# Patient Record
Sex: Male | Born: 1995 | Race: White | Hispanic: No | Smoking: Current every day smoker
Health system: Southern US, Community
[De-identification: ages and names within clinical notes are randomized; demographics above are authoritative.]

## PROBLEM LIST (undated history)

## (undated) DIAGNOSIS — J45909 Unspecified asthma, uncomplicated: Secondary | ICD-10-CM

---
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
## 2015-01-27 ENCOUNTER — Encounter (HOSPITAL_COMMUNITY): Payer: Self-pay

## 2015-01-27 ENCOUNTER — Emergency Department (HOSPITAL_COMMUNITY)
Admission: EM | Admit: 2015-01-27 | Discharge: 2015-01-27 | Disposition: A | Discharge status: Home / Self Care | Payer: Medicaid Other | Attending: Emergency Medicine | Admitting: Emergency Medicine

## 2015-01-27 DIAGNOSIS — R1030 Lower abdominal pain, unspecified: Secondary | ICD-10-CM | POA: Diagnosis present

## 2015-01-27 DIAGNOSIS — Z720 Tobacco use: Secondary | ICD-10-CM | POA: Insufficient documentation

## 2015-01-27 DIAGNOSIS — I889 Nonspecific lymphadenitis, unspecified: Secondary | ICD-10-CM

## 2015-01-27 DIAGNOSIS — Z792 Long term (current) use of antibiotics: Secondary | ICD-10-CM | POA: Diagnosis not present

## 2015-01-27 DIAGNOSIS — J45909 Unspecified asthma, uncomplicated: Secondary | ICD-10-CM | POA: Insufficient documentation

## 2015-01-27 MED ORDER — DOXYCYCLINE HYCLATE 100 MG PO CAPS: 100.0000 mg | ORAL_CAPSULE | Freq: Two times a day (BID) | ORAL | Status: DC

## 2015-01-27 MED ORDER — NAPROXEN 500 MG PO TABS: 500.0000 mg | ORAL_TABLET | Freq: Two times a day (BID) | ORAL | Status: DC

## 2015-01-27 NOTE — Discharge Instructions (Signed)
Lymphadenopathy  Lymphadenopathy means "disease of the lymph glands." But the term is usually used to describe swollen or enlarged lymph glands, also called lymph nodes. These are the bean-shaped organs found in many locations including the neck, underarm, and groin. Lymph glands are part of the immune system, which fights infections in your body. Lymphadenopathy can occur in just one area of the body, such as the neck, or it can be generalized, with lymph node enlargement in several areas. The nodes found in the neck are the most common sites of lymphadenopathy.  CAUSES  When your immune system responds to germs (such as viruses or bacteria ), infection-fighting cells and fluid build up. This causes the glands to grow in size. Usually, this is not something to worry about. Sometimes, the glands themselves can become infected and inflamed. This is called lymphadenitis.  Enlarged lymph nodes can be caused by many diseases:  · Bacterial disease, such as strep throat or a skin infection.  · Viral disease, such as a common cold.  · Other germs, such as Lyme disease, tuberculosis, or sexually transmitted diseases.  · Cancers, such as lymphoma (cancer of the lymphatic system) or leukemia (cancer of the white blood cells).  · Inflammatory diseases such as lupus or rheumatoid arthritis.  · Reactions to medications.  Many of the diseases above are rare, but important. This is why you should see your caregiver if you have lymphadenopathy.  SYMPTOMS  · Swollen, enlarged lumps in the neck, back of the head, or other locations.  · Tenderness.  · Warmth or redness of the skin over the lymph nodes.  · Fever.  DIAGNOSIS  Enlarged lymph nodes are often near the source of infection. They can help health care providers diagnose your illness. For instance:  · Swollen lymph nodes around the jaw might be caused by an infection in the mouth.  · Enlarged glands in the neck often signal a throat infection.  · Lymph nodes that are swollen in  more than one area often indicate an illness caused by a virus.  Your caregiver will likely know what is causing your lymphadenopathy after listening to your history and examining you. Blood tests, x-rays, or other tests may be needed. If the cause of the enlarged lymph node cannot be found, and it does not go away by itself, then a biopsy may be needed. Your caregiver will discuss this with you.  TREATMENT  Treatment for your enlarged lymph nodes will depend on the cause. Many times the nodes will shrink to normal size by themselves, with no treatment. Antibiotics or other medicines may be needed for infection. Only take over-the-counter or prescription medicines for pain, discomfort, or fever as directed by your caregiver.  HOME CARE INSTRUCTIONS  Swollen lymph glands usually return to normal when the underlying medical condition goes away. If they persist, contact your health-care provider. He/she might prescribe antibiotics or other treatments, depending on the diagnosis. Take any medications exactly as prescribed. Keep any follow-up appointments made to check on the condition of your enlarged nodes.  SEEK MEDICAL CARE IF:  · Swelling lasts for more than two weeks.  · You have symptoms such as weight loss, night sweats, fatigue, or fever that does not go away.  · The lymph nodes are hard, seem fixed to the skin, or are growing rapidly.  · Skin over the lymph nodes is red and inflamed. This could mean there is an infection.  SEEK IMMEDIATE MEDICAL CARE IF:  · Fluid   starts leaking from the area of the enlarged lymph node.  · You develop a fever of 102° F (38.9° C) or greater.  · Severe pain develops (not necessarily at the site of a large lymph node).  · You develop chest pain or shortness of breath.  · You develop worsening abdominal pain.  MAKE SURE YOU:  · Understand these instructions.  · Will watch your condition.  · Will get help right away if you are not doing well or get worse.  Document Released:  02/06/2008 Document Revised: 09/13/2013 Document Reviewed: 02/06/2008  ExitCare® Patient Information ©2015 [employer]. This information is not intended to replace advice given to you by your health care provider. Make sure you discuss any questions you have with your health care provider.  Swollen Lymph Nodes  The lymphatic system filters fluid from around cells. It is like a system of blood vessels. These channels carry lymph instead of blood. The lymphatic system is an important part of the immune (disease fighting) system. When people talk about "swollen glands in the neck," they are usually talking about swollen lymph nodes. The lymph nodes are like the little traps for infection. You and your caregiver may be able to feel lymph nodes, especially swollen nodes, in these common areas: the groin (inguinal area), armpits (axilla), and above the clavicle (supraclavicular). You may also feel them in the neck (cervical) and the back of the head just above the hairline (occipital).  Swollen glands occur when there is any condition in which the body responds with an allergic type of reaction. For instance, the glands in the neck can become swollen from insect bites or any type of minor infection on the head. These are very noticeable in children with only minor problems. Lymph nodes may also become swollen when there is a tumor or problem with the lymphatic system, such as Hodgkin's disease.  TREATMENT   · Most swollen glands do not require treatment. They can be observed (watched) for a short period of time, if your caregiver feels it is necessary. Most of the time, observation is not necessary.  · Antibiotics (medicines that kill germs) may be prescribed by your caregiver. Your caregiver may prescribe these if he or she feels the swollen glands are due to a bacterial (germ) infection. Antibiotics are not used if the swollen glands are caused by a virus.  HOME CARE INSTRUCTIONS   · Take medications as directed by  your caregiver. Only take over-the-counter or prescription medicines for pain, discomfort, or fever as directed by your caregiver.  SEEK MEDICAL CARE IF:   · If you begin to run a temperature greater than 102° F (38.9° C), or as your caregiver suggests.  MAKE SURE YOU:   · Understand these instructions.  · Will watch your condition.  · Will get help right away if you are not doing well or get worse.  Document Released: 04/19/2002 Document Revised: 07/22/2011 Document Reviewed: 04/29/2005  ExitCare® Patient Information ©2015 [employer]. This information is not intended to replace advice given to you by your health care provider. Make sure you discuss any questions you have with your health care provider.

## 2015-01-27 NOTE — ED Notes (Signed)
MD at bedside. 

## 2015-01-27 NOTE — ED Provider Notes (Signed)
CSN: [id number]     Arrival date & time 01/27/15  0741 History   First MD Initiated Contact with Patient 01/27/15 0745     Chief Complaint  Patient presents with  . Groin Pain      HPI  Patient presents with a concerned "I think I might have a blood clot".  He states that for about the last 6-8 weeks he's noticed the swelling in his right groin. States it is intermittently painful. States a week ago for "about like a golf ball". When it was swollen and it gave him some pain into his upper thigh. This seemed to be better at his last swollen. However remains tender to touch. Straight feels like his close irritated. Has had no leg infections with redness. No pelvic or GU infections. No history of STDs or UTIs. No skin abnormalities.  Past Medical History  Diagnosis Date  . Asthma    History reviewed. No pertinent past surgical history. Family History  Problem Relation Age of Onset  . Asthma Mother   . Diabetes Other    Social History  Substance Use Topics  . Smoking status: Current Every Day Smoker -- 1.00 packs/day    Types: Cigarettes  . Smokeless tobacco: None  . Alcohol Use: Yes     Comment: <1 time a month    Review of Systems  Constitutional: Negative for fever, chills, diaphoresis, appetite change and fatigue.  HENT: Negative for mouth sores, sore throat and trouble swallowing.   Eyes: Negative for visual disturbance.  Respiratory: Negative for cough, chest tightness, shortness of breath and wheezing.   Cardiovascular: Negative for chest pain.  Gastrointestinal: Negative for nausea, vomiting, abdominal pain, diarrhea and abdominal distention.  Endocrine: Negative for polydipsia, polyphagia and polyuria.  Genitourinary: Negative for dysuria, frequency and hematuria.       Tender lump in the right groin.  Musculoskeletal: Negative for gait problem.       Recent pain in the upper anterior thigh.  Skin: Negative for color change, pallor and rash.  Neurological: Negative  for dizziness, syncope, light-headedness and headaches.  Hematological: Does not bruise/bleed easily.  Psychiatric/Behavioral: Negative for behavioral problems and confusion.      Allergies  Review of patient's allergies indicates no known allergies.  Home Medications   Prior to Admission medications   Medication Sig Start Date End Date Taking? Authorizing Provider  amoxicillin (AMOXIL) 500 MG capsule Take 1 capsule (500 mg total) by mouth 3 (three) times daily. For 10 days Patient not taking: Reported on 06/21/2014 02/23/14   Tammy Triplett, PA-C  cephALEXin (KEFLEX) 500 MG capsule Take 2 capsules (1,000 mg total) by mouth 2 (two) times daily. 10/14/14   Burgess Amor, PA-C  diphenoxylate-atropine (LOMOTIL) 2.5-0.025 MG per tablet Take 1 tablet by mouth 4 (four) times daily as needed for diarrhea or loose stools. Patient not taking: Reported on 06/21/2014 01/25/13   Burgess Amor, PA-C  doxycycline (VIBRAMYCIN) 100 MG capsule Take 1 capsule (100 mg total) by mouth 2 (two) times daily. 01/27/15   Rolland Porter, MD  HYDROcodone-acetaminophen (NORCO/VICODIN) 5-325 MG per tablet Take one-two tabs po q 4-6 hrs prn pain Patient not taking: Reported on 06/21/2014 02/23/14   Tammy Triplett, PA-C  mupirocin ointment (BACTROBAN) 2 % Apply to the affected areas TID x 10 days Patient not taking: Reported on 06/21/2014 02/23/14   Tammy Triplett, PA-C  naproxen (NAPROSYN) 500 MG tablet Take 1 tablet (500 mg total) by mouth 2 (two) times daily. 01/27/15  Rolland Porter, MD  ondansetron (ZOFRAN ODT) 8 MG disintegrating tablet Take 1 tablet (8 mg total) by mouth every 8 (eight) hours as needed for nausea or vomiting. Patient not taking: Reported on 10/14/2014 06/21/14   Margarita Grizzle, MD  ondansetron (ZOFRAN) 8 MG tablet Take 1 tablet (8 mg total) by mouth every 8 (eight) hours as needed for nausea. Patient not taking: Reported on 06/21/2014 01/25/13   Burgess Amor, PA-C  predniSONE (DELTASONE) 10 MG tablet Take 6 tabs daily by mouth for  2 day,  Then 5 tabs daily for 2 days,  4 tabs daily for 2 days,  3 tabs daily for 2 days,  2 tabs daily for 2 days,  Then 1 tab daily for 2 days. 10/14/14   Burgess Amor, PA-C  PROAIR HFA 108 (90 BASE) MCG/ACT inhaler inhale 2 puffs by mouth every 4 hours AS NEEDED FOR WHEEZING 03/17/13   Merlyn Albert, MD   BP 159/77 mmHg  Pulse 67  Temp(Src) 97.8 F (36.6 C) (Oral)  Resp 20  Ht 5\' 7"  (1.702 m)  Wt 135 lb (61.236 kg)  BMI 21.14 kg/m2  SpO2 100% Physical Exam  Constitutional: He is oriented to person, place, and time. He appears well-developed and well-nourished. No distress.  HENT:  Head: Normocephalic.  Eyes: Conjunctivae are normal. Pupils are equal, round, and reactive to light. No scleral icterus.  Neck: Normal range of motion. Neck supple. No thyromegaly present.  Cardiovascular: Normal rate and regular rhythm.  Exam reveals no gallop and no friction rub.   No murmur heard. Pulmonary/Chest: Effort normal and breath sounds normal. No respiratory distress. He has no wheezes. He has no rales.  Abdominal: Soft. Bowel sounds are normal. He exhibits no distension. There is no tenderness. There is no rebound.  Genitourinary:     Musculoskeletal: Normal range of motion.  Neurological: He is alert and oriented to person, place, and time.  Skin: Skin is warm and dry. No rash noted.  Psychiatric: He has a normal mood and affect. His behavior is normal.    ED Course  Procedures (including critical care time) Labs Review Labs Reviewed - No data to display  Imaging Review No results found. I have personally reviewed and evaluated these images and lab results as part of my medical decision-making.   EKG Interpretation None      MDM   Final diagnoses:  Inguinal lymphadenitis    Enlarged inflamed inguinal lymph node without obvious additional lower leg infection or GU infection. Plan doxycycline, naproxen. Acid take the doxycycline with food to avoid GI upset. Warned of the  photosensitivity related to the tetracyclines.    Rolland Porter, MD 01/27/15 [phone]

## 2015-01-27 NOTE — ED Notes (Signed)
Pt reports pressure in r groin and intermittent numbness in r leg for the past year.

## 2015-04-26 ENCOUNTER — Encounter (HOSPITAL_COMMUNITY): Payer: Self-pay | Admitting: *Deleted

## 2015-04-26 ENCOUNTER — Emergency Department (HOSPITAL_COMMUNITY)
Admission: EM | Admit: 2015-04-26 | Discharge: 2015-04-26 | Disposition: A | Discharge status: Home / Self Care | Payer: Medicaid Other | Attending: Emergency Medicine | Admitting: Emergency Medicine

## 2015-04-26 DIAGNOSIS — Z792 Long term (current) use of antibiotics: Secondary | ICD-10-CM | POA: Diagnosis not present

## 2015-04-26 DIAGNOSIS — Z791 Long term (current) use of non-steroidal anti-inflammatories (NSAID): Secondary | ICD-10-CM | POA: Insufficient documentation

## 2015-04-26 DIAGNOSIS — S29012A Strain of muscle and tendon of back wall of thorax, initial encounter: Secondary | ICD-10-CM

## 2015-04-26 DIAGNOSIS — Z7951 Long term (current) use of inhaled steroids: Secondary | ICD-10-CM | POA: Insufficient documentation

## 2015-04-26 DIAGNOSIS — S233XXA Sprain of ligaments of thoracic spine, initial encounter: Secondary | ICD-10-CM

## 2015-04-26 DIAGNOSIS — X500XXA Overexertion from strenuous movement or load, initial encounter: Secondary | ICD-10-CM | POA: Insufficient documentation

## 2015-04-26 DIAGNOSIS — S239XXA Sprain of unspecified parts of thorax, initial encounter: Secondary | ICD-10-CM | POA: Insufficient documentation

## 2015-04-26 DIAGNOSIS — Z7952 Long term (current) use of systemic steroids: Secondary | ICD-10-CM | POA: Insufficient documentation

## 2015-04-26 DIAGNOSIS — Y9289 Other specified places as the place of occurrence of the external cause: Secondary | ICD-10-CM | POA: Diagnosis not present

## 2015-04-26 DIAGNOSIS — J45909 Unspecified asthma, uncomplicated: Secondary | ICD-10-CM | POA: Insufficient documentation

## 2015-04-26 DIAGNOSIS — Y998 Other external cause status: Secondary | ICD-10-CM | POA: Diagnosis not present

## 2015-04-26 DIAGNOSIS — S29019A Strain of muscle and tendon of unspecified wall of thorax, initial encounter: Secondary | ICD-10-CM | POA: Insufficient documentation

## 2015-04-26 DIAGNOSIS — Y9389 Activity, other specified: Secondary | ICD-10-CM | POA: Diagnosis not present

## 2015-04-26 DIAGNOSIS — F17210 Nicotine dependence, cigarettes, uncomplicated: Secondary | ICD-10-CM | POA: Insufficient documentation

## 2015-04-26 DIAGNOSIS — S3992XA Unspecified injury of lower back, initial encounter: Secondary | ICD-10-CM | POA: Diagnosis present

## 2015-04-26 MED ORDER — METHOCARBAMOL 500 MG PO TABS: 500.0000 mg | ORAL_TABLET | Freq: Three times a day (TID) | ORAL | Status: DC

## 2015-04-26 MED ORDER — DICLOFENAC SODIUM 75 MG PO TBEC: 75.0000 mg | DELAYED_RELEASE_TABLET | Freq: Two times a day (BID) | ORAL | Status: DC

## 2015-04-26 MED ORDER — DEXAMETHASONE 4 MG PO TABS: 4.0000 mg | ORAL_TABLET | Freq: Two times a day (BID) | ORAL | Status: DC

## 2015-04-26 MED ORDER — DIAZEPAM 5 MG PO TABS
10.0000 mg | ORAL_TABLET | Freq: Once | ORAL | Status: AC
  Administered 2015-04-26: 10 mg via ORAL
  Filled 2015-04-26: qty 2

## 2015-04-26 MED ORDER — KETOROLAC TROMETHAMINE 10 MG PO TABS
10.0000 mg | ORAL_TABLET | Freq: Once | ORAL | Status: AC
  Administered 2015-04-26: 10 mg via ORAL
  Filled 2015-04-26: qty 1

## 2015-04-26 MED ORDER — ONDANSETRON HCL 4 MG PO TABS
4.0000 mg | ORAL_TABLET | Freq: Once | ORAL | Status: AC
  Administered 2015-04-26: 4 mg via ORAL
  Filled 2015-04-26: qty 1

## 2015-04-26 MED ORDER — PREDNISONE 50 MG PO TABS
60.0000 mg | ORAL_TABLET | Freq: Once | ORAL | Status: AC
  Administered 2015-04-26: 60 mg via ORAL
  Filled 2015-04-26: qty 1

## 2015-04-26 NOTE — Discharge Instructions (Signed)
Please use warm Epson Salt baths daily until pain resolves. Use robaxin with caution. It may cause drowsiness. Use the other meds as suggested. See Dr August Saucerean for orthopedic evaluation if not improving. Muscle Strain A muscle strain (pulled muscle) happens when a muscle is stretched beyond normal length. It happens when a sudden, violent force stretches your muscle too far. Usually, a few of the fibers in your muscle are torn. Muscle strain is common in athletes. Recovery usually takes 1-2 weeks. Complete healing takes 5-6 weeks.  HOME CARE   Follow the PRICE method of treatment to help your injury get better. Do this the first 2-3 days after the injury:  Protect. Protect the muscle to keep it from getting injured again.  Rest. Limit your activity and rest the injured body part.  Ice. Put ice in a plastic bag. Place a towel between your skin and the bag. Then, apply the ice and leave it on from 15-20 minutes each hour. After the third day, switch to moist heat packs.  Compression. Use a splint or elastic bandage on the injured area for comfort. Do not put it on too tightly.  Elevate. Keep the injured body part above the level of your heart.  Only take medicine as told by your doctor.  Warm up before doing exercise to prevent future muscle strains. GET HELP IF:   You have more pain or puffiness (swelling) in the injured area.  You feel numbness, tingling, or notice a loss of strength in the injured area. MAKE SURE YOU:   Understand these instructions.  Will watch your condition.  Will get help right away if you are not doing well or get worse.   This information is not intended to replace advice given to you by your health care provider. Make sure you discuss any questions you have with your health care provider.   Document Released: 02/06/2008 Document Revised: 02/17/2013 Document Reviewed: 11/26/2012 Elsevier Interactive Patient Education [employer]2016 Elsevier Inc.

## 2015-04-26 NOTE — ED Notes (Signed)
Pt c/o back pain that started yesterday after lifting heavy block at work. States pain is in middle of back at goes up to neck at times.

## 2015-04-26 NOTE — ED Provider Notes (Signed)
CSN: [id number]     Arrival date & time 04/26/15  1517 History   First MD Initiated Contact with Patient 04/26/15 1604     Chief Complaint  Patient presents with  . Back Pain     (Consider location/radiation/quality/duration/timing/severity/associated sxs/prior Treatment) Patient is a 19 y.o. male presenting with back pain. The history is provided by the patient.  Back Pain Location:  Thoracic spine Radiates to: neck. Pain severity:  Moderate Pain is:  Same all the time Onset quality:  Gradual Duration:  1 day Timing:  Intermittent Progression:  Worsening Chronicity:  New Context: lifting heavy objects   Relieved by:  Nothing Worsened by:  Movement Associated symptoms: no bladder incontinence, no bowel incontinence and no perianal numbness   Risk factors: no recent surgery and no vascular disease     Past Medical History  Diagnosis Date  . Asthma    History reviewed. No pertinent past surgical history. Family History  Problem Relation Age of Onset  . Asthma Mother   . Diabetes Other    Social History  Substance Use Topics  . Smoking status: Current Every Day Smoker -- 1.00 packs/day    Types: Cigarettes  . Smokeless tobacco: None  . Alcohol Use: Yes     Comment: <1 time a month    Review of Systems  Respiratory: Positive for wheezing.   Gastrointestinal: Negative for bowel incontinence.  Genitourinary: Negative for bladder incontinence.  Musculoskeletal: Positive for back pain.  All other systems reviewed and are negative.     Allergies  Review of patient's allergies indicates no known allergies.  Home Medications   Prior to Admission medications   Medication Sig Start Date End Date Taking? Authorizing Provider  amoxicillin (AMOXIL) 500 MG capsule Take 1 capsule (500 mg total) by mouth 3 (three) times daily. For 10 days Patient not taking: Reported on 06/21/2014 02/23/14   Tammy Triplett, PA-C  cephALEXin (KEFLEX) 500 MG capsule Take 2 capsules (1,000  mg total) by mouth 2 (two) times daily. 10/14/14   Burgess Amor, PA-C  diphenoxylate-atropine (LOMOTIL) 2.5-0.025 MG per tablet Take 1 tablet by mouth 4 (four) times daily as needed for diarrhea or loose stools. Patient not taking: Reported on 06/21/2014 01/25/13   Burgess Amor, PA-C  doxycycline (VIBRAMYCIN) 100 MG capsule Take 1 capsule (100 mg total) by mouth 2 (two) times daily. 01/27/15   Rolland Porter, MD  HYDROcodone-acetaminophen (NORCO/VICODIN) 5-325 MG per tablet Take one-two tabs po q 4-6 hrs prn pain Patient not taking: Reported on 06/21/2014 02/23/14   Tammy Triplett, PA-C  mupirocin ointment (BACTROBAN) 2 % Apply to the affected areas TID x 10 days Patient not taking: Reported on 06/21/2014 02/23/14   Tammy Triplett, PA-C  naproxen (NAPROSYN) 500 MG tablet Take 1 tablet (500 mg total) by mouth 2 (two) times daily. 01/27/15   Rolland Porter, MD  ondansetron (ZOFRAN ODT) 8 MG disintegrating tablet Take 1 tablet (8 mg total) by mouth every 8 (eight) hours as needed for nausea or vomiting. Patient not taking: Reported on 10/14/2014 06/21/14   Margarita Grizzle, MD  ondansetron (ZOFRAN) 8 MG tablet Take 1 tablet (8 mg total) by mouth every 8 (eight) hours as needed for nausea. Patient not taking: Reported on 06/21/2014 01/25/13   Burgess Amor, PA-C  predniSONE (DELTASONE) 10 MG tablet Take 6 tabs daily by mouth for 2 day,  Then 5 tabs daily for 2 days,  4 tabs daily for 2 days,  3 tabs daily for 2 days,  2 tabs daily for 2 days,  Then 1 tab daily for 2 days. 10/14/14   Burgess AmorJulie Idol, PA-C  PROAIR HFA 108 (90 BASE) MCG/ACT inhaler inhale 2 puffs by mouth every 4 hours AS NEEDED FOR WHEEZING 03/17/13   Merlyn AlbertWilliam S Luking, MD   BP 106/74 mmHg  Pulse 69  Temp(Src) 97.4 F (36.3 C) (Oral)  Resp 16  Ht 5\' 7"  (1.702 m)  Wt 56.7 kg  BMI 19.57 kg/m2  SpO2 100% Physical Exam  Constitutional: He is oriented to person, place, and time. He appears well-developed and well-nourished.  Non-toxic appearance.  HENT:  Head: Normocephalic.   Right Ear: Tympanic membrane and external ear normal.  Left Ear: Tympanic membrane and external ear normal.  Eyes: EOM and lids are normal. Pupils are equal, round, and reactive to light.  Neck: Normal range of motion. Neck supple. Carotid bruit is not present.  Cardiovascular: Normal rate, regular rhythm, normal heart sounds, intact distal pulses and normal pulses.   Pulmonary/Chest: Breath sounds normal. No respiratory distress.  Abdominal: Soft. Bowel sounds are normal. There is no tenderness. There is no guarding.  Musculoskeletal: Normal range of motion.       Thoracic back: He exhibits tenderness, pain and spasm.  Lymphadenopathy:       Head (right side): No submandibular adenopathy present.       Head (left side): No submandibular adenopathy present.    He has no cervical adenopathy.  Neurological: He is alert and oriented to person, place, and time. He has normal strength. No cranial nerve deficit or sensory deficit.  Skin: Skin is warm and dry.  Psychiatric: He has a normal mood and affect. His speech is normal.  Nursing note and vitals reviewed.   ED Course  Procedures (including critical care time) Labs Review Labs Reviewed - No data to display  Imaging Review No results found. I have personally reviewed and evaluated these images and lab results as part of my medical decision-making.   EKG Interpretation None      MDM  Vital signs stable. Exam suggest muscle strain. Will use warm tub soaks, robaxin,decadron and diclofenac. Pt to see Dr August Saucerean (ortho) for additional eval if not improving.   Final diagnoses:  None    **I have reviewed nursing notes, vital signs, and all appropriate lab and imaging results for this patient.Ivery Quale*    Amoree Newlon, PA-C 04/26/15 1634  Eber HongBrian Miller, MD 04/26/15 [phone]

## 2015-08-15 ENCOUNTER — Emergency Department (HOSPITAL_COMMUNITY)
Admission: EM | Admit: 2015-08-15 | Discharge: 2015-08-15 | Disposition: A | Discharge status: Home / Self Care | Payer: Self-pay | Attending: Emergency Medicine | Admitting: Emergency Medicine

## 2015-08-15 ENCOUNTER — Emergency Department (HOSPITAL_COMMUNITY): Payer: Self-pay

## 2015-08-15 ENCOUNTER — Encounter (HOSPITAL_COMMUNITY): Payer: Self-pay | Admitting: Emergency Medicine

## 2015-08-15 DIAGNOSIS — Y999 Unspecified external cause status: Secondary | ICD-10-CM | POA: Insufficient documentation

## 2015-08-15 DIAGNOSIS — F17210 Nicotine dependence, cigarettes, uncomplicated: Secondary | ICD-10-CM | POA: Insufficient documentation

## 2015-08-15 DIAGNOSIS — J45909 Unspecified asthma, uncomplicated: Secondary | ICD-10-CM | POA: Insufficient documentation

## 2015-08-15 DIAGNOSIS — Y93F2 Activity, caregiving, lifting: Secondary | ICD-10-CM | POA: Insufficient documentation

## 2015-08-15 DIAGNOSIS — X500XXA Overexertion from strenuous movement or load, initial encounter: Secondary | ICD-10-CM | POA: Insufficient documentation

## 2015-08-15 DIAGNOSIS — Y929 Unspecified place or not applicable: Secondary | ICD-10-CM | POA: Insufficient documentation

## 2015-08-15 DIAGNOSIS — S46912A Strain of unspecified muscle, fascia and tendon at shoulder and upper arm level, left arm, initial encounter: Secondary | ICD-10-CM | POA: Insufficient documentation

## 2015-08-15 MED ORDER — PREDNISONE 20 MG PO TABS
40.0000 mg | ORAL_TABLET | Freq: Once | ORAL | Status: AC
  Administered 2015-08-15: 40 mg via ORAL
  Filled 2015-08-15: qty 2

## 2015-08-15 MED ORDER — DICLOFENAC SODIUM 75 MG PO TBEC: 75.0000 mg | DELAYED_RELEASE_TABLET | Freq: Two times a day (BID) | ORAL | Status: DC

## 2015-08-15 MED ORDER — HYDROCODONE-ACETAMINOPHEN 5-325 MG PO TABS
2.0000 | ORAL_TABLET | Freq: Once | ORAL | Status: AC
  Administered 2015-08-15: 2 via ORAL
  Filled 2015-08-15: qty 2

## 2015-08-15 MED ORDER — IBUPROFEN 800 MG PO TABS
800.0000 mg | ORAL_TABLET | Freq: Once | ORAL | Status: AC
  Administered 2015-08-15: 800 mg via ORAL
  Filled 2015-08-15: qty 1

## 2015-08-15 MED ORDER — DEXAMETHASONE 4 MG PO TABS: 4.0000 mg | ORAL_TABLET | Freq: Two times a day (BID) | ORAL | Status: AC

## 2015-08-15 MED ORDER — HYDROCODONE-ACETAMINOPHEN 5-325 MG PO TABS: 1.0000 | ORAL_TABLET | ORAL | Status: AC | PRN

## 2015-08-15 NOTE — ED Notes (Signed)
Pt was lifting a boat 4 days ago and started having left shoulder pain, today lifted heavy object and having increased pain

## 2015-08-15 NOTE — Discharge Instructions (Signed)
Please use the shoulder immobilizer until seen by Dr. Romeo AppleHarrison, the orthopedic specialist. Use diclofenac and Decadron daily with food. Use Norco every 4 hours if needed for pain. Please use the sling when up and about.

## 2015-08-18 NOTE — ED Provider Notes (Signed)
CSN: [id number]     Arrival date & time 08/15/15  2036 History   First MD Initiated Contact with Patient 08/15/15 2203     Chief Complaint  Patient presents with  . Shoulder Pain     (Consider location/radiation/quality/duration/timing/severity/associated sxs/prior Treatment) Patient is a 20 y.o. male presenting with shoulder pain. The history is provided by the patient.  Shoulder Pain Location:  Shoulder Time since incident:  4 days Injury: yes (heavy lifting.)   Shoulder location:  L shoulder Pain details:    Quality:  Sharp   Radiates to:  L elbow   Severity:  Moderate   Onset quality:  Gradual   Timing:  Intermittent   Progression:  Worsening Chronicity:  New Handedness:  Right-handed Dislocation: no   Relieved by:  Nothing Worsened by:  Movement (lifting) Ineffective treatments:  None tried Associated symptoms: decreased range of motion   Associated symptoms: no back pain, no neck pain, no numbness and no swelling     Past Medical History  Diagnosis Date  . Asthma    History reviewed. No pertinent past surgical history. Family History  Problem Relation Age of Onset  . Asthma Mother   . Diabetes Other    Social History  Substance Use Topics  . Smoking status: Current Every Day Smoker -- 1.00 packs/day    Types: Cigarettes  . Smokeless tobacco: None  . Alcohol Use: Yes     Comment: <1 time a month    Review of Systems  Constitutional: Negative for activity change.       All ROS Neg except as noted in HPI  HENT: Negative for nosebleeds.   Eyes: Negative for photophobia and discharge.  Respiratory: Negative for cough, shortness of breath and wheezing.   Cardiovascular: Negative for chest pain and palpitations.  Gastrointestinal: Negative for abdominal pain and blood in stool.  Genitourinary: Negative for dysuria, frequency and hematuria.  Musculoskeletal: Positive for arthralgias. Negative for back pain and neck pain.  Skin: Negative.   Neurological:  Negative for dizziness, seizures and speech difficulty.  Psychiatric/Behavioral: Negative for hallucinations and confusion.      Allergies  Review of patient's allergies indicates no known allergies.  Home Medications   Prior to Admission medications   Medication Sig Start Date End Date Taking? Authorizing Provider  amoxicillin (AMOXIL) 500 MG capsule Take 1 capsule (500 mg total) by mouth 3 (three) times daily. For 10 days Patient not taking: Reported on 06/21/2014 02/23/14   Tammy Triplett, PA-C  cephALEXin (KEFLEX) 500 MG capsule Take 2 capsules (1,000 mg total) by mouth 2 (two) times daily. Patient not taking: Reported on 04/26/2015 10/14/14   Burgess Amor, PA-C  dexamethasone (DECADRON) 4 MG tablet Take 1 tablet (4 mg total) by mouth 2 (two) times daily with a meal. 08/15/15   Ivery Quale, PA-C  diclofenac (VOLTAREN) 75 MG EC tablet Take 1 tablet (75 mg total) by mouth 2 (two) times daily. 08/15/15   Ivery Quale, PA-C  diphenoxylate-atropine (LOMOTIL) 2.5-0.025 MG per tablet Take 1 tablet by mouth 4 (four) times daily as needed for diarrhea or loose stools. Patient not taking: Reported on 06/21/2014 01/25/13   Burgess Amor, PA-C  doxycycline (VIBRAMYCIN) 100 MG capsule Take 1 capsule (100 mg total) by mouth 2 (two) times daily. Patient not taking: Reported on 04/26/2015 01/27/15   Rolland Porter, MD  HYDROcodone-acetaminophen (NORCO/VICODIN) 5-325 MG tablet Take 1 tablet by mouth every 4 (four) hours as needed. 08/15/15   Ivery Quale, PA-C  methocarbamol (  ROBAXIN) 500 MG tablet Take 1 tablet (500 mg total) by mouth 3 (three) times daily. 04/26/15   Ivery Quale, PA-C  mupirocin ointment (BACTROBAN) 2 % Apply to the affected areas TID x 10 days Patient not taking: Reported on 06/21/2014 02/23/14   Tammy Triplett, PA-C  naproxen (NAPROSYN) 500 MG tablet Take 1 tablet (500 mg total) by mouth 2 (two) times daily. Patient not taking: Reported on 04/26/2015 01/27/15   Rolland Porter, MD  ondansetron (ZOFRAN  ODT) 8 MG disintegrating tablet Take 1 tablet (8 mg total) by mouth every 8 (eight) hours as needed for nausea or vomiting. Patient not taking: Reported on 10/14/2014 06/21/14   Margarita Grizzle, MD  ondansetron (ZOFRAN) 8 MG tablet Take 1 tablet (8 mg total) by mouth every 8 (eight) hours as needed for nausea. Patient not taking: Reported on 06/21/2014 01/25/13   Burgess Amor, PA-C  predniSONE (DELTASONE) 10 MG tablet Take 6 tabs daily by mouth for 2 day,  Then 5 tabs daily for 2 days,  4 tabs daily for 2 days,  3 tabs daily for 2 days,  2 tabs daily for 2 days,  Then 1 tab daily for 2 days. Patient not taking: Reported on 04/26/2015 10/14/14   Burgess Amor, PA-C  PROAIR HFA 108 (90 BASE) MCG/ACT inhaler inhale 2 puffs by mouth every 4 hours AS NEEDED FOR WHEEZING 03/17/13   Merlyn Albert, MD   BP 125/76 mmHg  Pulse 69  Temp(Src) 97.7 F (36.5 C) (Oral)  Resp 14  Ht  (1.702 m)  Wt 56.7 kg  BMI 19.57 kg/m2  SpO2 99% Physical Exam  Constitutional: He is oriented to person, place, and time. He appears well-developed and well-nourished.  Non-toxic appearance.  HENT:  Head: Normocephalic.  Right Ear: Tympanic membrane and external ear normal.  Left Ear: Tympanic membrane and external ear normal.  Eyes: EOM and lids are normal. Pupils are equal, round, and reactive to light.  Neck: Normal range of motion. Neck supple. Carotid bruit is not present.  Cardiovascular: Normal rate, regular rhythm, normal heart sounds, intact distal pulses and normal pulses.   Pulmonary/Chest: Breath sounds normal. No respiratory distress.  Abdominal: Soft. Bowel sounds are normal. There is no tenderness. There is no guarding.  Musculoskeletal:       Left shoulder: He exhibits decreased range of motion, tenderness, crepitus and pain. He exhibits no deformity.  Patient unable to extend against resistance or raises her arm above his head without increased pain.  Lymphadenopathy:       Head (right side): No submandibular  adenopathy present.       Head (left side): No submandibular adenopathy present.    He has no cervical adenopathy.  Neurological: He is alert and oriented to person, place, and time. He has normal strength. No cranial nerve deficit or sensory deficit.  Skin: Skin is warm and dry.  Psychiatric: He has a normal mood and affect. His speech is normal.  Nursing note and vitals reviewed.   ED Course  Procedures (including critical care time) Labs Review Labs Reviewed - No data to display  Imaging Review No results found. I have personally reviewed and evaluated these images and lab results as part of my medical decision-making.   EKG Interpretation None      MDM  Vital signs reviewed. X-ray of the left shoulder are negative for new fracture or dislocation.  No gross sensory or vascular deficits appreciated. Discussed with the patient that this may be  a strain, but may also need to be evaluated for possible rotator cuff injury. Patient is fitted with a shoulder immobilizer. He'll be treated with Decadron, Norco, and diclofenac. The patient is referred to orthopedics for additional evaluation and management.    Final diagnoses:  Shoulder strain, left, initial encounter    **I have reviewed nursing notes, vital signs, and all appropriate lab and imaging results for this patient.Ivery Quale*    Gregorey Nabor, PA-C 08/18/15 1042  Linwood DibblesJon Knapp, MD 08/19/15 [phone]

## 2015-10-20 ENCOUNTER — Emergency Department (HOSPITAL_COMMUNITY): Payer: Self-pay

## 2015-10-20 ENCOUNTER — Emergency Department (HOSPITAL_COMMUNITY)
Admission: EM | Admit: 2015-10-20 | Discharge: 2015-10-20 | Disposition: A | Discharge status: Home / Self Care | Payer: Self-pay | Attending: Emergency Medicine | Admitting: Emergency Medicine

## 2015-10-20 ENCOUNTER — Encounter (HOSPITAL_COMMUNITY): Payer: Self-pay | Admitting: Cardiology

## 2015-10-20 DIAGNOSIS — M25511 Pain in right shoulder: Secondary | ICD-10-CM | POA: Insufficient documentation

## 2015-10-20 DIAGNOSIS — J45909 Unspecified asthma, uncomplicated: Secondary | ICD-10-CM | POA: Insufficient documentation

## 2015-10-20 DIAGNOSIS — F17210 Nicotine dependence, cigarettes, uncomplicated: Secondary | ICD-10-CM | POA: Insufficient documentation

## 2015-10-20 MED ORDER — METHOCARBAMOL 500 MG PO TABS
500.0000 mg | ORAL_TABLET | Freq: Three times a day (TID) | ORAL | Status: DC
Start: 2015-10-20 — End: 2015-10-25

## 2015-10-20 MED ORDER — KETOROLAC TROMETHAMINE 60 MG/2ML IM SOLN
60.0000 mg | Freq: Once | INTRAMUSCULAR | Status: AC
  Administered 2015-10-20: 60 mg via INTRAMUSCULAR
  Filled 2015-10-20: qty 2

## 2015-10-20 MED ORDER — DICLOFENAC SODIUM 75 MG PO TBEC: 75.0000 mg | DELAYED_RELEASE_TABLET | Freq: Two times a day (BID) | ORAL | Status: DC

## 2015-10-20 MED ORDER — PREDNISONE 10 MG PO TABS: ORAL_TABLET | ORAL | Status: DC

## 2015-10-20 NOTE — ED Provider Notes (Signed)
CSN: [id number]     Arrival date & time 10/20/15  1226 History   First MD Initiated Contact with Patient 10/20/15 1244     Chief Complaint  Patient presents with  . Extremity Pain     (Consider location/radiation/quality/duration/timing/severity/associated sxs/prior Treatment) Patient is a 20 y.o. male presenting with arm injury. The history is provided by the patient. No language interpreter was used.  Arm Injury Location:  Arm Time since incident:  1 week Arm location:  R arm Pain details:    Quality:  Aching   Radiates to:  Does not radiate   Severity:  Moderate   Onset quality:  Gradual   Duration:  1 week   Timing:  Constant   Progression:  Worsening Chronicity:  Recurrent Handedness:  Right-handed Dislocation: no   Foreign body present:  Unable to specify Relieved by:  Nothing Worsened by:  Nothing tried Ineffective treatments:  None tried Associated symptoms: swelling   Associated symptoms: no neck pain     Past Medical History  Diagnosis Date  . Asthma    History reviewed. No pertinent past surgical history. Family History  Problem Relation Age of Onset  . Asthma Mother   . Diabetes Other    Social History  Substance Use Topics  . Smoking status: Current Every Day Smoker -- 1.00 packs/day    Types: Cigarettes  . Smokeless tobacco: None  . Alcohol Use: Yes     Comment: <1 time a month    Review of Systems  Musculoskeletal: Negative for neck pain.  All other systems reviewed and are negative.     Allergies  Review of patient's allergies indicates no known allergies.  Home Medications   Prior to Admission medications   Medication Sig Start Date End Date Taking? Authorizing Provider  amoxicillin (AMOXIL) 500 MG capsule Take 1 capsule (500 mg total) by mouth 3 (three) times daily. For 10 days Patient not taking: Reported on 06/21/2014 02/23/14   Tammy Triplett, PA-C  cephALEXin (KEFLEX) 500 MG capsule Take 2 capsules (1,000 mg total) by mouth 2  (two) times daily. Patient not taking: Reported on 04/26/2015 10/14/14   Burgess Amor, PA-C  dexamethasone (DECADRON) 4 MG tablet Take 1 tablet (4 mg total) by mouth 2 (two) times daily with a meal. 08/15/15   Ivery Quale, PA-C  diclofenac (VOLTAREN) 75 MG EC tablet Take 1 tablet (75 mg total) by mouth 2 (two) times daily. 08/15/15   Ivery Quale, PA-C  diphenoxylate-atropine (LOMOTIL) 2.5-0.025 MG per tablet Take 1 tablet by mouth 4 (four) times daily as needed for diarrhea or loose stools. Patient not taking: Reported on 06/21/2014 01/25/13   Burgess Amor, PA-C  doxycycline (VIBRAMYCIN) 100 MG capsule Take 1 capsule (100 mg total) by mouth 2 (two) times daily. Patient not taking: Reported on 04/26/2015 01/27/15   Rolland Porter, MD  HYDROcodone-acetaminophen (NORCO/VICODIN) 5-325 MG tablet Take 1 tablet by mouth every 4 (four) hours as needed. 08/15/15   Ivery Quale, PA-C  methocarbamol (ROBAXIN) 500 MG tablet Take 1 tablet (500 mg total) by mouth 3 (three) times daily. 04/26/15   Ivery Quale, PA-C  mupirocin ointment (BACTROBAN) 2 % Apply to the affected areas TID x 10 days Patient not taking: Reported on 06/21/2014 02/23/14   Tammy Triplett, PA-C  naproxen (NAPROSYN) 500 MG tablet Take 1 tablet (500 mg total) by mouth 2 (two) times daily. Patient not taking: Reported on 04/26/2015 01/27/15   Rolland Porter, MD  ondansetron (ZOFRAN ODT) 8 MG disintegrating tablet  Take 1 tablet (8 mg total) by mouth every 8 (eight) hours as needed for nausea or vomiting. Patient not taking: Reported on 10/14/2014 06/21/14   Margarita Grizzle, MD  ondansetron (ZOFRAN) 8 MG tablet Take 1 tablet (8 mg total) by mouth every 8 (eight) hours as needed for nausea. Patient not taking: Reported on 06/21/2014 01/25/13   Burgess Amor, PA-C  predniSONE (DELTASONE) 10 MG tablet Take 6 tabs daily by mouth for 2 day,  Then 5 tabs daily for 2 days,  4 tabs daily for 2 days,  3 tabs daily for 2 days,  2 tabs daily for 2 days,  Then 1 tab daily for 2  days. Patient not taking: Reported on 04/26/2015 10/14/14   Burgess Amor, PA-C  PROAIR HFA 108 (90 BASE) MCG/ACT inhaler inhale 2 puffs by mouth every 4 hours AS NEEDED FOR WHEEZING 03/17/13   Merlyn Albert, MD   BP 126/77 mmHg  Pulse 58  Temp(Src) 97.9 F (36.6 C)  Resp 18  Ht 5\' 4"  (1.626 m)  Wt 61.236 kg  BMI 23.16 kg/m2  SpO2 100% Physical Exam  Constitutional: He is oriented to person, place, and time. He appears well-developed and well-nourished.  HENT:  Head: Normocephalic and atraumatic.  Eyes: EOM are normal. Pupils are equal, round, and reactive to light.  Neck: Normal range of motion.  Cardiovascular: Normal rate and normal heart sounds.   Pulmonary/Chest: Effort normal.  Abdominal: He exhibits no distension.  Musculoskeletal: Normal range of motion.  Neurological: He is alert and oriented to person, place, and time.  Skin: Skin is warm.  Psychiatric: He has a normal mood and affect.  Nursing note and vitals reviewed.   ED Course  Procedures (including critical care time) Labs Review Labs Reviewed - No data to display  Imaging Review Dg Cervical Spine Complete  10/20/2015  CLINICAL DATA:  20 year old with 1 week history of right upper extremity pain and tingling extending from the neck down to the hand. Current history of carpal tunnel syndrome. Patient does heavy lifting at work. No known injuries. EXAM: CERVICAL SPINE - COMPLETE 4+ VIEW COMPARISON:  None. FINDINGS: Straightening of the usual cervical lordosis. Anatomic posterior alignment. Cervicothoracic levoscoliosis. Well preserved disc spaces. Normal prevertebral soft tissues. Small bilateral cervical ribs. Facet joints intact. No significant bony foraminal stenoses, allowing for the degree of obliquity. No static evidence of instability. IMPRESSION: Straightening of the usual lordosis which may reflect positioning and/or spasm. Cervicothoracic levoscoliosis. Small bilateral cervical ribs. Electronically Signed   By:  Hulan Saas M.D.   On: 10/20/2015 13:06   Dg Shoulder Right  10/20/2015  CLINICAL DATA:  20 year old male with a history of tingling right upper extremity EXAM: RIGHT SHOULDER - 2+ VIEW COMPARISON:  None. FINDINGS: There is no evidence of fracture or dislocation. There is no evidence of arthropathy or other focal bone abnormality. Soft tissues are unremarkable. IMPRESSION: Negative. Signed, Yvone Neu. Loreta Ave, DO [facility] Electronically Signed   By: Gilmer Mor D.O.   On: 10/20/2015 13:04   I have personally reviewed and evaluated these images and lab results as part of my medical decision-making.   EKG Interpretation None      MDM Pt given torodol IM.   Pt counseled on need to follow up with [facility] for evaluation.  Meds ordered this encounter  Medications  . ketorolac (TORADOL) injection 60 mg    Sig:   . diclofenac (VOLTAREN) 75 MG EC tablet  Sig: Take 1 tablet (75 mg total) by mouth 2 (two) times daily.    Dispense:  14 tablet    Refill:  0    Order Specific Question:  Supervising Provider    Answer:  MILLER, BRIAN [[id number]]  . methocarbamol (ROBAXIN) 500 MG tablet    Sig: Take 1 tablet (500 mg total) by mouth 3 (three) times daily.    Dispense:  21 tablet    Refill:  0    Order Specific Question:  Supervising Provider    Answer:  MILLER, BRIAN [[id number]]  . predniSONE (DELTASONE) 10 MG tablet    Sig: 6,5,4,3,2,1 taper    Dispense:  21 tablet    Refill:  0    Order Specific Question:  Supervising Provider    Answer:  Eber HongMILLER, BRIAN [[id number]]     Final diagnoses:  Shoulder pain, right    Meds ordered this encounter  Medications  . ketorolac (TORADOL) injection 60 mg    Sig:   . diclofenac (VOLTAREN) 75 MG EC tablet    Sig: Take 1 tablet (75 mg total) by mouth 2 (two) times daily.    Dispense:  14 tablet    Refill:  0    Order Specific Question:  Supervising Provider    Answer:  MILLER, BRIAN [[id number]]  .  methocarbamol (ROBAXIN) 500 MG tablet    Sig: Take 1 tablet (500 mg total) by mouth 3 (three) times daily.    Dispense:  21 tablet    Refill:  0    Order Specific Question:  Supervising Provider    Answer:  MILLER, BRIAN [[id number]]  . predniSONE (DELTASONE) 10 MG tablet    Sig: 6,5,4,3,2,1 taper    Dispense:  21 tablet    Refill:  0    Order Specific Question:  Supervising Provider    Answer:  Eber HongMILLER, BRIAN [[id number]]  An After Visit Summary was printed and given to the patient.    Lonia SkinnerLeslie K FalmouthSofia, PA-C 10/20/15 1603  Marily MemosJason Mesner, MD 10/21/15 0730

## 2015-10-20 NOTE — ED Notes (Signed)
Right arm pain times one week.  No injury

## 2015-10-20 NOTE — Discharge Instructions (Signed)

## 2015-10-25 ENCOUNTER — Encounter (HOSPITAL_COMMUNITY): Payer: Self-pay | Admitting: Emergency Medicine

## 2015-10-25 ENCOUNTER — Emergency Department (HOSPITAL_COMMUNITY)
Admission: EM | Admit: 2015-10-25 | Discharge: 2015-10-25 | Disposition: A | Discharge status: Home / Self Care | Payer: Medicaid Other | Attending: Emergency Medicine | Admitting: Emergency Medicine

## 2015-10-25 DIAGNOSIS — M25511 Pain in right shoulder: Secondary | ICD-10-CM | POA: Insufficient documentation

## 2015-10-25 DIAGNOSIS — M62838 Other muscle spasm: Secondary | ICD-10-CM | POA: Insufficient documentation

## 2015-10-25 DIAGNOSIS — F17210 Nicotine dependence, cigarettes, uncomplicated: Secondary | ICD-10-CM | POA: Insufficient documentation

## 2015-10-25 DIAGNOSIS — J45909 Unspecified asthma, uncomplicated: Secondary | ICD-10-CM | POA: Insufficient documentation

## 2015-10-25 MED ORDER — IBUPROFEN 800 MG PO TABS
800.0000 mg | ORAL_TABLET | Freq: Once | ORAL | Status: AC
  Administered 2015-10-25: 800 mg via ORAL
  Filled 2015-10-25: qty 1

## 2015-10-25 MED ORDER — DIAZEPAM 5 MG PO TABS
5.0000 mg | ORAL_TABLET | Freq: Once | ORAL | Status: AC
  Administered 2015-10-25: 5 mg via ORAL
  Filled 2015-10-25: qty 1

## 2015-10-25 MED ORDER — DIAZEPAM 5 MG PO TABS: 5.0000 mg | ORAL_TABLET | Freq: Three times a day (TID) | ORAL | Status: AC | PRN

## 2015-10-25 MED ORDER — TRAMADOL HCL 50 MG PO TABS
50.0000 mg | ORAL_TABLET | Freq: Four times a day (QID) | ORAL | Status: AC | PRN
Start: 2015-10-25 — End: ?

## 2015-10-25 NOTE — ED Notes (Signed)
Pt states continued right shoulder pain. Was sen in the ER for the same. Pt has not followed up w/ ortho.

## 2015-10-25 NOTE — ED Provider Notes (Signed)
CSN: [id number]     Arrival date & time 10/25/15  2216 History   First MD Initiated Contact with Patient 10/25/15 2222     Chief Complaint  Patient presents with  . Shoulder Pain     (Consider location/radiation/quality/duration/timing/severity/associated sxs/prior Treatment) HPI   [name] [name] is a 20 y.o. male who presents to the Emergency Department complaining of persistent right shoulder, upper back and neck pain.  He states that he was seen here on 10/20/15 for same and pain has not improved.  He reports having to repeatedly push his truck to "jump it off" which he states aggravates the pain.  He states the pain is worse with movement and improves at rest.  He denies fever, headaches, numbness or weakness of the upper extremities.     Past Medical History  Diagnosis Date  . Asthma    History reviewed. No pertinent past surgical history. Family History  Problem Relation Age of Onset  . Asthma Mother   . Diabetes Other    Social History  Substance Use Topics  . Smoking status: Current Every Day Smoker -- 1.00 packs/day    Types: Cigarettes  . Smokeless tobacco: None  . Alcohol Use: Yes     Comment: <1 time a month    Review of Systems  Constitutional: Negative for fever and chills.  Gastrointestinal: Negative for vomiting.  Genitourinary: Negative for dysuria and difficulty urinating.  Musculoskeletal: Positive for arthralgias (right shoulder pain) and neck pain. Negative for joint swelling.  Skin: Negative for color change and wound.  Neurological: Negative for dizziness, weakness, numbness and headaches.  All other systems reviewed and are negative.     Allergies  Review of patient's allergies indicates no known allergies.  Home Medications   Prior to Admission medications   Medication Sig Start Date End Date Taking? Authorizing Provider  dexamethasone (DECADRON) 4 MG tablet Take 1 tablet (4 mg total) by mouth 2 (two) times daily with a meal. 08/15/15   Ivery QualeHobson  Bryant, PA-C  diazepam (VALIUM) 5 MG tablet Take 1 tablet (5 mg total) by mouth every 8 (eight) hours as needed for muscle spasms. 10/25/15   Yalitza Teed, PA-C  HYDROcodone-acetaminophen (NORCO/VICODIN) 5-325 MG tablet Take 1 tablet by mouth every 4 (four) hours as needed. 08/15/15   Ivery QualeHobson Bryant, PA-C  mupirocin ointment (BACTROBAN) 2 % Apply to the affected areas TID x 10 days Patient not taking: Reported on 06/21/2014 02/23/14   Cortland Crehan, PA-C  ondansetron (ZOFRAN ODT) 8 MG disintegrating tablet Take 1 tablet (8 mg total) by mouth every 8 (eight) hours as needed for nausea or vomiting. Patient not taking: Reported on 10/14/2014 06/21/14   Margarita Grizzleanielle Ray, MD  ondansetron (ZOFRAN) 8 MG tablet Take 1 tablet (8 mg total) by mouth every 8 (eight) hours as needed for nausea. Patient not taking: Reported on 06/21/2014 01/25/13   Burgess AmorJulie Idol, PA-C  predniSONE (DELTASONE) 10 MG tablet 6,5,4,3,2,1 taper 10/20/15   Elson AreasLeslie K Sofia, PA-C  PROAIR HFA 108 [phone](90 BASE) MCG/ACT inhaler inhale 2 puffs by mouth every 4 hours AS NEEDED FOR WHEEZING 03/17/13   Merlyn AlbertWilliam S Luking, MD  traMADol (ULTRAM) 50 MG tablet Take 1 tablet (50 mg total) by mouth every 6 (six) hours as needed. 10/25/15   Fatimata Talsma, PA-C   Pulse 70  Temp(Src) 97.9 F (36.6 C) (Oral)  Resp 18  Ht 5\' 7"  (1.702 m)  Wt 61.236 kg  BMI 21.14 kg/m2  SpO2 100% Physical Exam  Constitutional:  He is oriented to person, place, and time. He appears well-developed and well-nourished. No distress.  HENT:  Head: Normocephalic and atraumatic.  Neck: Normal range of motion. Neck supple. No thyromegaly present.  Cardiovascular: Normal rate, regular rhythm and intact distal pulses.   No murmur heard. Pulmonary/Chest: Effort normal and breath sounds normal. No respiratory distress. He exhibits no tenderness.  Musculoskeletal: He exhibits tenderness. He exhibits no edema.  ttp of the anterior right shoulder and right trapezius muscle.  Pain reproduced with  abduction of the right arm and rotation of the shoulder.  Radial pulse is brisk, distal sensation intact, CR< 2 sec. Grip strength is strong and symmetrical.   No abrasions, edema , erythema or step-off deformity of the joint.   Lymphadenopathy:    He has no cervical adenopathy.  Neurological: He is alert and oriented to person, place, and time. He has normal strength. No sensory deficit. He exhibits normal muscle tone. Coordination normal.  Reflex Scores:      Tricep reflexes are 1+ on the right side and 2+ on the left side.      Bicep reflexes are 1+ on the right side and 2+ on the left side. Skin: Skin is warm and dry.  Nursing note and vitals reviewed.   ED Course  Procedures (including critical care time) Labs Review Labs Reviewed - No data to display  Imaging Review No results found. I have personally reviewed and evaluated these images and lab results as part of my medical decision-making.   EKG Interpretation None      MDM   Final diagnoses:  Shoulder pain, right  Muscle spasms of neck    Pt seen here on 10/20/15 for same.  Not improving with medications previously prescribed.  Sx's likely musculoskeletal.  No concerning sx's for emergent neurological process.    Previous imaging reviewed by me, neg for fx.  Rx for valium and ultram for pain.  Agrees to arrange ortho f/u.      Pauline Aus, PA-C 10/25/15 2341  Bethann Berkshire, MD 10/25/15 [phone]

## 2015-10-25 NOTE — ED Notes (Signed)
Pt c/o rt shoulder pain, neck pain and upper back pain.

## 2015-10-25 NOTE — Discharge Instructions (Signed)
Shoulder Pain The shoulder is the joint that connects your arm to your body. Muscles and band-like tissues that connect bones to muscles (tendons) hold the joint together. Shoulder pain is felt if an injury or medical problem affects one or more parts of the shoulder. HOME CARE   Put ice on the sore area.  Put ice in a plastic bag.  Place a towel between your skin and the bag.  Leave the ice on for 15-20 minutes, 03-04 times a day for the first 2 days.  Stop using cold packs if they do not help with the pain.  If you were given something to keep your shoulder from moving (sling; shoulder immobilizer), wear it as told. Only take it off to shower or bathe.  Move your arm as little as possible, but keep your hand moving to prevent puffiness (swelling).  Squeeze a soft ball or foam pad as much as possible to help prevent swelling.  Take medicine as told by your doctor. GET HELP IF:  You have progressing new pain in your arm, hand, or fingers.  Your hand or fingers get cold.  Your medicine does not help lessen your pain. GET HELP RIGHT AWAY IF:   Your arm, hand, or fingers are numb or tingling.  Your arm, hand, or fingers are puffy (swollen), painful, or turn white or blue. MAKE SURE YOU:   Understand these instructions.  Will watch your condition.  Will get help right away if you are not doing well or get worse.   This information is not intended to replace advice given to you by your health care provider. Make sure you discuss any questions you have with your health care provider.   Document Released: 10/16/2007 Document Revised: 05/20/2014 Document Reviewed: 08/22/2014 Elsevier Interactive Patient Education 2016 [employer]  

## 2016-03-09 ENCOUNTER — Encounter (HOSPITAL_COMMUNITY): Payer: Self-pay | Admitting: Emergency Medicine

## 2016-03-09 ENCOUNTER — Emergency Department (HOSPITAL_COMMUNITY)
Admission: EM | Admit: 2016-03-09 | Discharge: 2016-03-09 | Disposition: A | Discharge status: Home / Self Care | Payer: Medicaid Other | Attending: Emergency Medicine | Admitting: Emergency Medicine

## 2016-03-09 DIAGNOSIS — J45909 Unspecified asthma, uncomplicated: Secondary | ICD-10-CM | POA: Insufficient documentation

## 2016-03-09 DIAGNOSIS — F17210 Nicotine dependence, cigarettes, uncomplicated: Secondary | ICD-10-CM | POA: Insufficient documentation

## 2016-03-09 DIAGNOSIS — Z79899 Other long term (current) drug therapy: Secondary | ICD-10-CM | POA: Insufficient documentation

## 2016-03-09 DIAGNOSIS — T7840XA Allergy, unspecified, initial encounter: Secondary | ICD-10-CM

## 2016-03-09 DIAGNOSIS — R0602 Shortness of breath: Secondary | ICD-10-CM | POA: Insufficient documentation

## 2016-03-09 DIAGNOSIS — L509 Urticaria, unspecified: Secondary | ICD-10-CM | POA: Insufficient documentation

## 2016-03-09 MED ORDER — FAMOTIDINE IN NACL 20-0.9 MG/50ML-% IV SOLN
INTRAVENOUS | Status: AC
  Administered 2016-03-09: 20 mg via INTRAVENOUS
  Filled 2016-03-09: qty 50

## 2016-03-09 MED ORDER — SODIUM CHLORIDE 0.9 % IV BOLUS (SEPSIS)
1000.0000 mL | Freq: Once | INTRAVENOUS | Status: AC
  Administered 2016-03-09: 1000 mL via INTRAVENOUS

## 2016-03-09 MED ORDER — DIPHENHYDRAMINE HCL 50 MG/ML IJ SOLN
INTRAMUSCULAR | Status: AC
  Administered 2016-03-09: 12.5 mg via INTRAVENOUS
  Filled 2016-03-09: qty 1

## 2016-03-09 MED ORDER — FAMOTIDINE 20 MG PO TABS: 20.0000 mg | ORAL_TABLET | Freq: Two times a day (BID) | ORAL | 0 refills | Status: AC

## 2016-03-09 MED ORDER — PREDNISONE 10 MG PO TABS: 40.0000 mg | ORAL_TABLET | Freq: Every day | ORAL | 0 refills | Status: DC

## 2016-03-09 MED ORDER — METHYLPREDNISOLONE SODIUM SUCC 125 MG IJ SOLR
125.0000 mg | Freq: Once | INTRAMUSCULAR | Status: AC
  Administered 2016-03-09: 125 mg via INTRAVENOUS
  Filled 2016-03-09: qty 2

## 2016-03-09 MED ORDER — SODIUM CHLORIDE 0.9 % IV SOLN: INTRAVENOUS | Status: DC

## 2016-03-09 MED ORDER — FAMOTIDINE IN NACL 20-0.9 MG/50ML-% IV SOLN
20.0000 mg | Freq: Once | INTRAVENOUS | Status: AC
  Administered 2016-03-09: 20 mg via INTRAVENOUS

## 2016-03-09 MED ORDER — DIPHENHYDRAMINE HCL 25 MG PO TABS: 25.0000 mg | ORAL_TABLET | Freq: Four times a day (QID) | ORAL | 0 refills | Status: DC

## 2016-03-09 MED ORDER — DIPHENHYDRAMINE HCL 50 MG/ML IJ SOLN
12.5000 mg | Freq: Once | INTRAMUSCULAR | Status: AC
  Administered 2016-03-09: 12.5 mg via INTRAVENOUS

## 2016-03-09 NOTE — Discharge Instructions (Signed)
Go home and March with soap and water. Take the Benadryl 25 mg every 6 hours for the next 2 days. Take the prednisone as directed for the next 5 days. Take the Pepcid twice a day for the next 7 days. Return for any new or worse symptoms to include tongue swelling lip swelling trouble breathing.

## 2016-03-09 NOTE — ED Provider Notes (Addendum)
[facility]PT Provider Note   CSN: [id number] Arrival date & time: 03/09/16  1837     History   Chief Complaint Chief Complaint  Patient presents with  . Allergic Reaction    HPI [name] CrazeCarl W [name] is a 20 y.o. male.  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 They also use kerosene to keep the pressures from drying out. Patient had exposure to the kerosene which is not unusual on his hands and running down his arms. The stain was a new type of stain. Shortly after this patient also was coming behind bushes so it's possible there could've been a reaction to something associated with the bushes. Patient started to get hives all over itching everywhere intensely. No tongue swelling no lip swelling some slight shortness of breath and a little bit dizzy feeling. Patient never had anything like this happen before. I should saturations up front toe were 99%.      Past Medical History:  Diagnosis Date  . Asthma     Patient Active Problem List   Diagnosis Date Noted  . FOREIGN BODY, LEG 09/29/2008    No past surgical history on file.     Home Medications    Prior to Admission medications   Medication Sig Start Date End Date Taking? Authorizing Provider  dexamethasone (DECADRON) 4 MG tablet Take 1 tablet (4 mg total) by mouth 2 (two) times daily with a meal. 08/15/15   Ivery QualeHobson Bryant, PA-C  diazepam (VALIUM) 5 MG tablet Take 1 tablet (5 mg total) by mouth every 8 (eight) hours as needed for muscle spasms. 10/25/15   Tammy Triplett, PA-C  diphenhydrAMINE (BENADRYL) 25 MG tablet Take 1 tablet (25 mg total) by mouth every 6 (six) hours. 03/09/16   Vanetta MuldersScott Adalynne Steffensmeier, MD  famotidine (PEPCID) 20 MG tablet Take 1 tablet (20 mg total) by mouth 2 (two) times daily. 03/09/16   Vanetta MuldersScott Loneta Tamplin, MD  HYDROcodone-acetaminophen (NORCO/VICODIN) 5-325 MG tablet Take 1 tablet by mouth every 4 (four) hours as needed. 08/15/15   Ivery QualeHobson Bryant, PA-C  mupirocin ointment (BACTROBAN) 2 %  Apply to the affected areas TID x 10 days Patient not taking: Reported on 06/21/2014 02/23/14   Tammy Triplett, PA-C  ondansetron (ZOFRAN ODT) 8 MG disintegrating tablet Take 1 tablet (8 mg total) by mouth every 8 (eight) hours as needed for nausea or vomiting. Patient not taking: Reported on 10/14/2014 06/21/14   Margarita Grizzleanielle Ray, MD  ondansetron (ZOFRAN) 8 MG tablet Take 1 tablet (8 mg total) by mouth every 8 (eight) hours as needed for nausea. Patient not taking: Reported on 06/21/2014 01/25/13   Burgess AmorJulie Idol, PA-C  predniSONE (DELTASONE) 10 MG tablet 6,5,4,3,2,1 taper 10/20/15   Elson AreasLeslie K Sofia, PA-C  predniSONE (DELTASONE) 10 MG tablet Take 4 tablets (40 mg total) by mouth daily. 03/09/16   Vanetta MuldersScott Eileen Croswell, MD  PROAIR HFA 108 (90 BASE) MCG/ACT inhaler inhale 2 puffs by mouth every 4 hours AS NEEDED FOR WHEEZING 03/17/13   Merlyn AlbertWilliam S Luking, MD  traMADol (ULTRAM) 50 MG tablet Take 1 tablet (50 mg total) by mouth every 6 (six) hours as needed. 10/25/15   Tammy Triplett, PA-C    Family History Family History  Problem Relation Age of Onset  . Asthma Mother   . Diabetes Other     Social History Social History  Substance Use Topics  . Smoking status: Current Every Day Smoker    Packs/day: 1.00    Types: Cigarettes  . Smokeless tobacco: Never  Used  . Alcohol use Yes     Comment: <1 time a month     Allergies   Review of patient's allergies indicates no known allergies.   Review of Systems Review of Systems  Constitutional: Negative for fever.  HENT: Negative for congestion.   Eyes: Negative for visual disturbance.  Respiratory: Positive for shortness of breath.   Cardiovascular: Negative for chest pain.  Gastrointestinal: Negative for abdominal pain, nausea and vomiting.  Genitourinary: Negative for hematuria.  Musculoskeletal: Negative for myalgias.  Skin: Positive for rash.  Allergic/Immunologic: Negative for environmental allergies and food allergies.  Neurological: Positive for dizziness.   Hematological: Does not bruise/bleed easily.  Psychiatric/Behavioral: Negative for confusion.     Physical Exam Updated Vital Signs BP 107/66 (BP Location: Left Arm)   Pulse 67   Temp 97.7 F (36.5 C) (Oral)   Resp 18   SpO2 99%   Physical Exam  Constitutional: He is oriented to person, place, and time. He appears well-developed and well-nourished. No distress.  HENT:  Head: Normocephalic and atraumatic.  Mouth/Throat: Oropharynx is clear and moist. No oropharyngeal exudate.  No tongue or lip swelling  Eyes: Conjunctivae and EOM are normal. Pupils are equal, round, and reactive to light.  Neck: Normal range of motion. Neck supple.  Cardiovascular: Regular rhythm.   Tachycardic  Pulmonary/Chest: Effort normal and breath sounds normal. No respiratory distress. He has no wheezes.  Abdominal: Soft. Bowel sounds are normal. There is no tenderness.  Musculoskeletal: Normal range of motion. He exhibits no edema.  Neurological: He is alert and oriented to person, place, and time. No cranial nerve deficit. He exhibits normal muscle tone. Coordination normal.  Skin: Rash noted. There is erythema.  Skin scattered highs predominantly upper extremities shoulders trunk thighs some on the feet.  Nursing note and vitals reviewed.    ED Treatments / Results  Labs (all labs ordered are listed, but only abnormal results are displayed) Labs Reviewed - No data to display  EKG  EKG Interpretation None       Radiology No results found.  Procedures Procedures (including critical care time)  Medications Ordered in ED Medications  sodium chloride 0.9 % bolus 1,000 mL (1,000 mLs Intravenous New Bag/Given 03/09/16 1859)    And  0.9 %  sodium chloride infusion (not administered)  diphenhydrAMINE (BENADRYL) injection 12.5 mg (12.5 mg Intravenous Given 03/09/16 1902)  famotidine (PEPCID) IVPB 20 mg premix (0 mg Intravenous Stopped 03/09/16 1949)  methylPREDNISolone sodium succinate  (SOLU-MEDROL) 125 mg/2 mL injection 125 mg (125 mg Intravenous Given 03/09/16 1919)     Initial Impression / Assessment and Plan / ED Course  I have reviewed the triage vital signs and the nursing notes.  Pertinent labs & imaging results that were available during my care of the patient were reviewed by me and considered in my medical decision making (see chart for details).  Clinical Course    Patient initial vital signs noted. The low-grade temperature 100.6 and tachycardia.  However patient states he was feeling fine earlier today. The exposure to kerosene is normal. Exposure to this type of stain is something new plus he was working around bushes. Patient was significant improvement with Benadryl and Pepcid. Solu-Medrol given IV.  We'll continue to monitor will recheck vital signs. Overall patient was significant improvement.  If patient continues to have improvement will treat with 5 day course of prednisone 48 hours of Benadryl and 7 day course of Pepcid.  Also will recommend the patient  go home and wash with soap and water.  Patient's temp now normal probably elevated due to skin flushing. Heart rate now in the 60's  Final Clinical Impressions(s) / ED Diagnoses   Final diagnoses:  Allergic reaction, initial encounter  Urticaria    New Prescriptions New Prescriptions   DIPHENHYDRAMINE (BENADRYL) 25 MG TABLET    Take 1 tablet (25 mg total) by mouth every 6 (six) hours.   FAMOTIDINE (PEPCID) 20 MG TABLET    Take 1 tablet (20 mg total) by mouth 2 (two) times daily.   PREDNISONE (DELTASONE) 10 MG TABLET    Take 4 tablets (40 mg total) by mouth daily.     Vanetta MuldersScott Castin Donaghue, MD 03/09/16 1934    Vanetta MuldersScott Tinaya Ceballos, MD 03/09/16 2010

## 2016-03-09 NOTE — ED Triage Notes (Signed)
Patient c/o hives with itching, feeling dizzy with some shortness of breath. Per patient symptoms started 15 minutes ago. Patient denies any new medications or foods. Per patient using a new kind of paint at work in which he has on his skin. Patient also states he has kerosine on skin. O2 sat 99% in triage. Patient able to handle oral secretions. Denies any swelling in tongue or lips.

## 2017-02-03 ENCOUNTER — Encounter (HOSPITAL_COMMUNITY): Payer: Self-pay | Admitting: Emergency Medicine

## 2017-02-03 ENCOUNTER — Emergency Department (HOSPITAL_COMMUNITY)
Admission: EM | Admit: 2017-02-03 | Discharge: 2017-02-03 | Disposition: A | Discharge status: Home / Self Care | Payer: Self-pay | Attending: Emergency Medicine | Admitting: Emergency Medicine

## 2017-02-03 DIAGNOSIS — L237 Allergic contact dermatitis due to plants, except food: Secondary | ICD-10-CM | POA: Insufficient documentation

## 2017-02-03 DIAGNOSIS — J45909 Unspecified asthma, uncomplicated: Secondary | ICD-10-CM | POA: Insufficient documentation

## 2017-02-03 DIAGNOSIS — F17210 Nicotine dependence, cigarettes, uncomplicated: Secondary | ICD-10-CM | POA: Insufficient documentation

## 2017-02-03 DIAGNOSIS — R21 Rash and other nonspecific skin eruption: Secondary | ICD-10-CM

## 2017-02-03 DIAGNOSIS — Z79899 Other long term (current) drug therapy: Secondary | ICD-10-CM | POA: Insufficient documentation

## 2017-02-03 MED ORDER — DIPHENHYDRAMINE HCL 25 MG PO CAPS: 25.0000 mg | ORAL_CAPSULE | Freq: Four times a day (QID) | ORAL | 0 refills | Status: AC | PRN

## 2017-02-03 MED ORDER — PREDNISONE 10 MG (21) PO TBPK: ORAL_TABLET | Freq: Every day | ORAL | 0 refills | Status: AC

## 2017-02-03 NOTE — Discharge Instructions (Signed)
Benadryl for itching and inflammation. Prednisone until all gone. Follow up with family doctor as needed.

## 2017-02-03 NOTE — ED Triage Notes (Signed)
Patient c/o rash to entire body that is itching and burning. Per patient believes rash is from poison oak started the day after cutting poison oak accidentally with chain.

## 2017-02-03 NOTE — ED Provider Notes (Signed)
[facility] Provider Note   CSN: [id number] Arrival date & time: 02/03/17  1259     History   Chief Complaint Chief Complaint  Patient presents with  . Rash    HPI [name] [name] is a 21 y.o. male.  HPI [name] [name] is a 21 y.o. male with history of asthma, presents to emergency department complaining of rash. Patient states he noticed several days ago rash to his face and his hands. He states he noticed a rash after working outside. Since then the rash has spread all over his body excluding his back, and his abdomen. Rash is very itchy and burning. Rashes similar to that of poison ivy in the past. He denies any recent change in personal products such as lotions, detergents, soaps. No new medications. He tried to put hydrocortisone cream which has not helped. Denies any other complaints at this time.  Past Medical History:  Diagnosis Date  . Asthma     Patient Active Problem List   Diagnosis Date Noted  . FOREIGN BODY, LEG 09/29/2008    No past surgical history on file.     Home Medications    Prior to Admission medications   Medication Sig Start Date End Date Taking? Authorizing Provider  dexamethasone (DECADRON) 4 MG tablet Take 1 tablet (4 mg total) by mouth 2 (two) times daily with a meal. 08/15/15   Ivery Quale, PA-C  diazepam (VALIUM) 5 MG tablet Take 1 tablet (5 mg total) by mouth every 8 (eight) hours as needed for muscle spasms. 10/25/15   Triplett, Tammy, PA-C  diphenhydrAMINE (BENADRYL) 25 MG tablet Take 1 tablet (25 mg total) by mouth every 6 (six) hours. 03/09/16   Vanetta Mulders, MD  famotidine (PEPCID) 20 MG tablet Take 1 tablet (20 mg total) by mouth 2 (two) times daily. 03/09/16   Vanetta Mulders, MD  HYDROcodone-acetaminophen (NORCO/VICODIN) 5-325 MG tablet Take 1 tablet by mouth every 4 (four) hours as needed. 08/15/15   Ivery Quale, PA-C  mupirocin ointment (BACTROBAN) 2 % Apply to the affected areas TID x 10 days Patient not taking:  Reported on 06/21/2014 02/23/14   Triplett, Tammy, PA-C  ondansetron (ZOFRAN ODT) 8 MG disintegrating tablet Take 1 tablet (8 mg total) by mouth every 8 (eight) hours as needed for nausea or vomiting. Patient not taking: Reported on 10/14/2014 06/21/14   Margarita Grizzle, MD  ondansetron (ZOFRAN) 8 MG tablet Take 1 tablet (8 mg total) by mouth every 8 (eight) hours as needed for nausea. Patient not taking: Reported on 06/21/2014 01/25/13   Burgess Amor, PA-C  predniSONE (DELTASONE) 10 MG tablet 6,5,4,3,2,1 taper 10/20/15   Elson Areas, PA-C  predniSONE (DELTASONE) 10 MG tablet Take 4 tablets (40 mg total) by mouth daily. 03/09/16   Vanetta Mulders, MD  PROAIR HFA 108 (90 BASE) MCG/ACT inhaler inhale 2 puffs by mouth every 4 hours AS NEEDED FOR WHEEZING 03/17/13   Merlyn Albert, MD  traMADol (ULTRAM) 50 MG tablet Take 1 tablet (50 mg total) by mouth every 6 (six) hours as needed. 10/25/15   Pauline Aus, PA-C    Family History Family History  Problem Relation Age of Onset  . Asthma Mother   . Diabetes Other     Social History Social History  Substance Use Topics  . Smoking status: Current Every Day Smoker    Packs/day: 1.00    Types: Cigarettes  . Smokeless tobacco: Never Used  . Alcohol use Yes     Comment: <  1 time a month     Allergies   Patient has no known allergies.   Review of Systems Review of Systems  Constitutional: Negative for chills and fever.  Respiratory: Negative for cough, chest tightness and shortness of breath.   Cardiovascular: Negative for chest pain, palpitations and leg swelling.  Musculoskeletal: Negative for arthralgias, myalgias, neck pain and neck stiffness.  Skin: Positive for rash.  Allergic/Immunologic: Negative for immunocompromised state.  Neurological: Negative for dizziness, weakness, light-headedness, numbness and headaches.  All other systems reviewed and are negative.    Physical Exam Updated Vital Signs BP 131/66 (BP Location: Right Arm)    Pulse 70   Temp 98 F (36.7 C) (Oral)   Resp 20   Ht  (1.702 m)   Wt 56.7 kg (125 lb)   SpO2 100%   BMI 19.58 kg/m   Physical Exam  Constitutional: He appears well-developed and well-nourished. No distress.  HENT:  No oral mucosal lesions  Eyes: Conjunctivae are normal.  Neck: Neck supple.  Cardiovascular: Normal rate.   Pulmonary/Chest: No respiratory distress.  Abdominal: He exhibits no distension.  Skin: Skin is warm and dry.  Erythematous, vesicular rash to the face, especially for head, bilateral arms and hands, bilateral legs, bilateral groin.  Nursing note and vitals reviewed.    ED Treatments / Results  Labs (all labs ordered are listed, but only abnormal results are displayed) Labs Reviewed - No data to display  EKG  EKG Interpretation None       Radiology No results found.  Procedures Procedures (including critical care time)  Medications Ordered in ED Medications - No data to display   Initial Impression / Assessment and Plan / ED Course  I have reviewed the triage vital signs and the nursing notes.  Pertinent labs & imaging results that were available during my care of the patient were reviewed by me and considered in my medical decision making (see chart for details).     Patient to the emergency department with a rash, which he believes could be poison oak or poison ivy. No new personal products, no new medications, no contact with new clothing or dyes. No oral mucosal lesions, difficulty breathing, no swelling to the lips, oropharynx, tongue. Will treat with prednisone taper and Benadryl. Return precautions discussed.  Vitals:   02/03/17 1340  BP: 131/66  Pulse: 70  Resp: 20  Temp: 98 F (36.7 C)  SpO2: 100%     Final Clinical Impressions(s) / ED Diagnoses   Final diagnoses:  Rash and nonspecific skin eruption  Poison oak dermatitis    New Prescriptions New Prescriptions   DIPHENHYDRAMINE (BENADRYL) 25 MG CAPSULE     Take 1 capsule (25 mg total) by mouth every 6 (six) hours as needed.   PREDNISONE (STERAPRED UNI-PAK 21 TAB) 10 MG (21) TBPK TABLET    Take by mouth daily. Take 6 tabs by mouth daily  for 2 days, then 5 tabs for 2 days, then 4 tabs for 2 days, then 3 tabs for 2 days, 2 tabs for 2 days, then 1 tab by mouth daily for 2 days     Jaynie Crumble, Cordelia Poche 02/03/17 1608    Bethann Berkshire, MD 02/03/17 2108

## 2017-02-14 ENCOUNTER — Encounter (HOSPITAL_COMMUNITY): Payer: Self-pay | Admitting: Emergency Medicine

## 2017-02-14 ENCOUNTER — Emergency Department (HOSPITAL_COMMUNITY)
Admission: EM | Admit: 2017-02-14 | Discharge: 2017-02-14 | Disposition: A | Discharge status: Home / Self Care | Payer: Self-pay | Attending: Emergency Medicine | Admitting: Emergency Medicine

## 2017-02-14 ENCOUNTER — Emergency Department (HOSPITAL_COMMUNITY): Payer: Self-pay

## 2017-02-14 DIAGNOSIS — Y92410 Unspecified street and highway as the place of occurrence of the external cause: Secondary | ICD-10-CM | POA: Insufficient documentation

## 2017-02-14 DIAGNOSIS — S0081XA Abrasion of other part of head, initial encounter: Secondary | ICD-10-CM | POA: Insufficient documentation

## 2017-02-14 DIAGNOSIS — S40811A Abrasion of right upper arm, initial encounter: Secondary | ICD-10-CM | POA: Insufficient documentation

## 2017-02-14 DIAGNOSIS — Y9389 Activity, other specified: Secondary | ICD-10-CM | POA: Insufficient documentation

## 2017-02-14 DIAGNOSIS — J45909 Unspecified asthma, uncomplicated: Secondary | ICD-10-CM | POA: Insufficient documentation

## 2017-02-14 DIAGNOSIS — Y999 Unspecified external cause status: Secondary | ICD-10-CM | POA: Insufficient documentation

## 2017-02-14 DIAGNOSIS — Z79899 Other long term (current) drug therapy: Secondary | ICD-10-CM | POA: Insufficient documentation

## 2017-02-14 DIAGNOSIS — S0011XA Contusion of right eyelid and periocular area, initial encounter: Secondary | ICD-10-CM | POA: Insufficient documentation

## 2017-02-14 DIAGNOSIS — S40812A Abrasion of left upper arm, initial encounter: Secondary | ICD-10-CM | POA: Insufficient documentation

## 2017-02-14 DIAGNOSIS — R1010 Upper abdominal pain, unspecified: Secondary | ICD-10-CM | POA: Insufficient documentation

## 2017-02-14 DIAGNOSIS — R0789 Other chest pain: Secondary | ICD-10-CM | POA: Insufficient documentation

## 2017-02-14 DIAGNOSIS — T07XXXA Unspecified multiple injuries, initial encounter: Secondary | ICD-10-CM

## 2017-02-14 DIAGNOSIS — F17210 Nicotine dependence, cigarettes, uncomplicated: Secondary | ICD-10-CM | POA: Insufficient documentation

## 2017-02-14 MED ORDER — IOPAMIDOL (ISOVUE-300) INJECTION 61%
100.0000 mL | Freq: Once | INTRAVENOUS | Status: AC | PRN
  Administered 2017-02-14: 100 mL via INTRAVENOUS

## 2017-02-14 MED ORDER — MORPHINE SULFATE (PF) 4 MG/ML IV SOLN
4.0000 mg | Freq: Once | INTRAVENOUS | Status: AC
  Administered 2017-02-14: 4 mg via INTRAVENOUS
  Filled 2017-02-14: qty 1

## 2017-02-14 MED ORDER — IBUPROFEN 800 MG PO TABS: 800.0000 mg | ORAL_TABLET | Freq: Three times a day (TID) | ORAL | 0 refills | Status: AC

## 2017-02-14 NOTE — ED Notes (Signed)
To CT
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
## 2017-02-14 NOTE — Discharge Instructions (Signed)
Your x-rays your x-rays are normal, ibuprofen as needed for pain, thoroughly cleaned her skin when you get home to prevent poison ivy of possible  See the list of doctors below for family Dr. follow-up as needed.  [facility] Primary Care Doctor List    Kari Baars MD. Specialty: Pulmonary Disease Contact information: [address]  [address]  [place] [address]  [phone]   Syliva Overman, MD. Specialty: [facility] Medicine Contact information: [address]  [place] [address]  [phone]   Lilyan Punt, MD. Specialty: [facility] Medicine Contact information: [address]  [place] [address]  [phone]   Avon Gully, MD Specialty: Internal Medicine Contact information: [address] [place] [address]  [phone]   Catalina Pizza, MD. Specialty: Internal Medicine Contact information: [address]  [place] [address]  [phone]    [facility] (Dr. Selena Batten) Specialty: Family Medicine Contact information: [address]  [place] [address]  [phone]   John Giovanni, MD. Specialty: [facility] Medicine Contact information: [address]  [address]  [place] [address]  [phone]   Carylon Perches, MD. Specialty: Internal Medicine Contact information: [address]  [address]  [place] [address]  [phone]    [facility]  [address] [place], [place] [address] [phone]  Services [facility] offers a variety of basic health services.  Services include but are not limited to: Blood pressure checks  Heart rate checks  Blood sugar checks  Urine analysis  Rapid strep tests  Pregnancy tests.  Health education and referrals  People needing more complex services will be directed to a physician online. Using these virtual visits, doctors can evaluate and prescribe medicine and treatments. There will be  no medication on-site, though [facility] will help patients fill their prescriptions at little to no cost.   For More information please go to: [email or web address]
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
## 2017-02-25 ENCOUNTER — Emergency Department (HOSPITAL_COMMUNITY): Payer: Self-pay

## 2017-02-25 ENCOUNTER — Encounter (HOSPITAL_COMMUNITY): Payer: Self-pay | Admitting: *Deleted

## 2017-02-25 ENCOUNTER — Emergency Department (HOSPITAL_COMMUNITY)
Admission: EM | Admit: 2017-02-25 | Discharge: 2017-02-25 | Disposition: A | Discharge status: Home / Self Care | Payer: Self-pay | Attending: Emergency Medicine | Admitting: Emergency Medicine

## 2017-02-25 DIAGNOSIS — R0789 Other chest pain: Secondary | ICD-10-CM | POA: Insufficient documentation

## 2017-02-25 DIAGNOSIS — F17210 Nicotine dependence, cigarettes, uncomplicated: Secondary | ICD-10-CM | POA: Insufficient documentation

## 2017-02-25 DIAGNOSIS — J45909 Unspecified asthma, uncomplicated: Secondary | ICD-10-CM | POA: Insufficient documentation

## 2017-02-25 LAB — BASIC METABOLIC PANEL
ANION GAP: 7 (ref 5–15)
BUN: 6 mg/dL (ref 6–20)
CALCIUM: 9.2 mg/dL (ref 8.9–10.3)
CO2: 26 mmol/L (ref 22–32)
Chloride: 105 mmol/L (ref 101–111)
Creatinine, Ser: 0.79 mg/dL (ref 0.61–1.24)
Glucose, Bld: 77 mg/dL (ref 65–99)
Potassium: 4.3 mmol/L (ref 3.5–5.1)
Sodium: 138 mmol/L (ref 135–145)

## 2017-02-25 LAB — URINALYSIS, ROUTINE W REFLEX MICROSCOPIC
Bilirubin Urine: NEGATIVE
Glucose, UA: NEGATIVE mg/dL
Hgb urine dipstick: NEGATIVE
KETONES UR: NEGATIVE mg/dL
LEUKOCYTES UA: NEGATIVE
NITRITE: NEGATIVE
PROTEIN: NEGATIVE mg/dL
Specific Gravity, Urine: 1.008 (ref 1.005–1.030)
pH: 6 (ref 5.0–8.0)

## 2017-02-25 LAB — RAPID URINE DRUG SCREEN, HOSP PERFORMED
Amphetamines: NOT DETECTED
BENZODIAZEPINES: NOT DETECTED
Barbiturates: NOT DETECTED
COCAINE: NOT DETECTED
Opiates: NOT DETECTED
Tetrahydrocannabinol: POSITIVE — AB

## 2017-02-25 LAB — CBC
HCT: 43.9 % (ref 39.0–52.0)
HEMOGLOBIN: 14.9 g/dL (ref 13.0–17.0)
MCH: 32 pg (ref 26.0–34.0)
MCHC: 33.9 g/dL (ref 30.0–36.0)
MCV: 94.4 fL (ref 78.0–100.0)
Platelets: 268 10*3/uL (ref 150–400)
RBC: 4.65 MIL/uL (ref 4.22–5.81)
RDW: 13.3 % (ref 11.5–15.5)
WBC: 7.7 10*3/uL (ref 4.0–10.5)

## 2017-02-25 LAB — I-STAT TROPONIN, ED: TROPONIN I, POC: 0 ng/mL (ref 0.00–0.08)

## 2017-02-25 MED ORDER — GI COCKTAIL ~~LOC~~
30.0000 mL | Freq: Once | ORAL | Status: AC
  Administered 2017-02-25: 30 mL via ORAL
  Filled 2017-02-25: qty 30

## 2017-02-25 MED ORDER — IBUPROFEN 800 MG PO TABS: 800.0000 mg | ORAL_TABLET | Freq: Three times a day (TID) | ORAL | 0 refills | Status: AC

## 2017-02-25 NOTE — ED Triage Notes (Signed)
To ED for eval of cp and dizziness for the past couple days. Some nausea. Doesn't feel like eating. Unable to tell what makes pain better or worse.

## 2017-02-25 NOTE — Discharge Instructions (Signed)
Take ibuprofen 3 times a day with meals. Do not take other antiinflammatories at the same time (Advil, motrin, naproxen, aleve).  You may take tylenol if you need further pain control.  Marijuana use may be making you nauseated. Stop using to help improve your symptoms.  Follow up with [facility] if you pain is not improving.  Return to the ED if you develop fevers, chills, persistent pain, or any new or worsening symptoms.

## 2017-02-25 NOTE — ED Provider Notes (Signed)
[facility] EMERGENCY DEPARTMENT Provider Note   CSN: [id number] Arrival date & time: 02/25/17  1746     History   Chief Complaint Chief Complaint  Patient presents with  . Chest Pain    HPI [name] [name] is a 21 y.o. male presenting with chest pain.  Patient states that beginning yesterday, he started to have chest pain. It's a dull ache with intermittent sharp pains. Pain moves around, but is worst under his left breast. He does not know what makes it better or worse. He has associated nausea without vomiting. He has tried ibuprofen without relief of symptoms. He reports 2 episodes of feeling dizzy when he is standing up, improved when he sits/relaxes and is less stressed. He denies fevers, chills, cough, shortness of breath, vomiting, abdominal pain, urinary symptoms, abnormal bowel movements, or pain or swelling of his legs. He denies current dizziness. He denies cardiac history. He denies sick contacts. He states that he does a lot of lifting and movement for work. He he smokes 'a lot' of cigarettes daily, he denies alcohol abuse. Pt reports he has had a lot of stress and been very anxious this past week. He did not elaborate as to what has been making him stressed.  HPI  Past Medical History:  Diagnosis Date  . Asthma     Patient Active Problem List   Diagnosis Date Noted  . FOREIGN BODY, LEG 09/29/2008    No past surgical history on file.     Home Medications    Prior to Admission medications   Medication Sig Start Date End Date Taking? Authorizing Provider  albuterol (PROVENTIL HFA;VENTOLIN HFA) 108 (90 Base) MCG/ACT inhaler Inhale 2 puffs into the lungs every 6 (six) hours as needed for wheezing or shortness of breath.   Yes [provider]  dexamethasone (DECADRON) 4 MG tablet Take 1 tablet (4 mg total) by mouth 2 (two) times daily with a meal. Patient not taking: Reported on 02/25/2017 08/15/15   Ivery Quale, PA-C  diazepam (VALIUM)  5 MG tablet Take 1 tablet (5 mg total) by mouth every 8 (eight) hours as needed for muscle spasms. Patient not taking: Reported on 02/25/2017 10/25/15   Pauline Aus, PA-C  diphenhydrAMINE (BENADRYL) 25 mg capsule Take 1 capsule (25 mg total) by mouth every 6 (six) hours as needed. Patient not taking: Reported on 02/25/2017 02/03/17   Jaynie Crumble, PA-C  famotidine (PEPCID) 20 MG tablet Take 1 tablet (20 mg total) by mouth 2 (two) times daily. Patient not taking: Reported on 02/25/2017 03/09/16   Vanetta Mulders, MD  HYDROcodone-acetaminophen (NORCO/VICODIN) 5-325 MG tablet Take 1 tablet by mouth every 4 (four) hours as needed. Patient not taking: Reported on 02/25/2017 08/15/15   Ivery Quale, PA-C  ibuprofen (ADVIL,MOTRIN) 800 MG tablet Take 1 tablet (800 mg total) by mouth 3 (three) times daily. Patient not taking: Reported on 02/25/2017 02/14/17   Eber Hong, MD  ibuprofen (ADVIL,MOTRIN) 800 MG tablet Take 1 tablet (800 mg total) by mouth 3 (three) times daily with meals. 02/25/17   Goble Fudala, PA-C  mupirocin ointment (BACTROBAN) 2 % Apply to the affected areas TID x 10 days Patient not taking: Reported on 06/21/2014 02/23/14   Triplett, Tammy, PA-C  ondansetron (ZOFRAN ODT) 8 MG disintegrating tablet Take 1 tablet (8 mg total) by mouth every 8 (eight) hours as needed for nausea or vomiting. Patient not taking: Reported on 10/14/2014 06/21/14   Margarita Grizzle, MD  ondansetron (ZOFRAN) 8 MG  tablet Take 1 tablet (8 mg total) by mouth every 8 (eight) hours as needed for nausea. Patient not taking: Reported on 06/21/2014 01/25/13   Burgess Amor, PA-C  predniSONE (STERAPRED UNI-PAK 21 TAB) 10 MG (21) TBPK tablet Take by mouth daily. Take 6 tabs by mouth daily  for 2 days, then 5 tabs for 2 days, then 4 tabs for 2 days, then 3 tabs for 2 days, 2 tabs for 2 days, then 1 tab by mouth daily for 2 days Patient not taking: Reported on 02/25/2017 02/03/17   Jaynie Crumble, PA-C  PROAIR HFA 108  (90 BASE) MCG/ACT inhaler inhale 2 puffs by mouth every 4 hours AS NEEDED FOR WHEEZING Patient not taking: Reported on 02/25/2017 03/17/13   Merlyn Albert, MD  traMADol (ULTRAM) 50 MG tablet Take 1 tablet (50 mg total) by mouth every 6 (six) hours as needed. Patient not taking: Reported on 02/25/2017 10/25/15   Pauline Aus, PA-C    Family History Family History  Problem Relation Age of Onset  . Asthma Mother   . Diabetes Other     Social History Social History  Substance Use Topics  . Smoking status: Current Every Day Smoker    Packs/day: 1.00    Types: Cigarettes  . Smokeless tobacco: Never Used  . Alcohol use Yes     Comment: <1 time a month     Allergies   Patient has no known allergies.   Review of Systems Review of Systems  Cardiovascular: Positive for chest pain.  Gastrointestinal: Positive for nausea.  Neurological: Positive for dizziness (not currently).  Psychiatric/Behavioral: The patient is nervous/anxious.   All other systems reviewed and are negative.    Physical Exam Updated Vital Signs BP 119/68 (BP Location: Right Arm)   Pulse (!) 59   Temp 98.5 F (36.9 C) (Oral)   Resp (!) 22   SpO2 100%   Physical Exam  Constitutional: He is oriented to person, place, and time. He appears well-developed and well-nourished. No distress.  HENT:  Head: Normocephalic and atraumatic.  Eyes: EOM are normal.  Neck: Normal range of motion.  Cardiovascular: Normal rate, regular rhythm and intact distal pulses.   Pulmonary/Chest: Effort normal. No respiratory distress. He exhibits tenderness.  Speaking full sentences without difficulty. Good air movement in all lung fields. Tenderness to palpation of anterior chest wall, worst at central/sternal area  Abdominal: Soft. Bowel sounds are normal. He exhibits no distension. There is no tenderness.  Musculoskeletal: Normal range of motion.  Neurological: He is alert and oriented to person, place, and time.  Skin:  Skin is warm and dry.  Psychiatric: He has a normal mood and affect.  Nursing note and vitals reviewed.    ED Treatments / Results  Labs (all labs ordered are listed, but only abnormal results are displayed) Labs Reviewed  URINALYSIS, ROUTINE W REFLEX MICROSCOPIC - Abnormal; Notable for the following:       Result Value   Color, Urine STRAW (*)    All other components within normal limits  RAPID URINE DRUG SCREEN, HOSP PERFORMED - Abnormal; Notable for the following:    Tetrahydrocannabinol POSITIVE (*)    All other components within normal limits  BASIC METABOLIC PANEL  CBC  I-STAT TROPONIN, ED    EKG  EKG Interpretation  Date/Time:  Tuesday February 25 2017 17:55:37 EDT Ventricular Rate:  84 PR Interval:  148 QRS Duration: 96 QT Interval:  346 QTC Calculation: 408 R Axis:   92 Text  Interpretation:  Normal sinus rhythm with sinus arrhythmia Rightward axis Incomplete right bundle branch block Borderline ECG Confirmed by Benjiman Core [phone]) on 02/25/2017 10:05:44 PM       Radiology Dg Chest 2 View  Result Date: 02/25/2017 CLINICAL DATA:  Left-sided chest pain and shortness of breath. EXAM: CHEST  2 VIEW COMPARISON:  02/14/2017 FINDINGS: The heart size and mediastinal contours are within normal limits. Both lungs are clear. The visualized skeletal structures are unremarkable. IMPRESSION: No active cardiopulmonary disease. Electronically Signed   By: Signa Kell M.D.   On: 02/25/2017 19:16    Procedures Procedures (including critical care time)  Medications Ordered in ED Medications  gi cocktail (Maalox,Lidocaine,Donnatal) (30 mLs Oral Given 02/25/17 2148)     Initial Impression / Assessment and Plan / ED Course  I have reviewed the triage vital signs and the nursing notes.  Pertinent labs & imaging results that were available during my care of the patient were reviewed by me and considered in my medical decision making (see chart for details).      Patient presenting with chest pain. Physical exam shows tenderness to palpation of the anterior chest wall. He has a very active job, in which he has to lift and move frequently. Additionally, patient reports increased stress and anxiety. Basic labs and troponin reassuring. UDS positive for marijuana. EKG and CXR reassuring. Symptoms were not improved with GI cocktail. Likely musculoskeletal pain. Doubt cardiac or pulmonary cause. HEART score 1, low risk. Pt states that he is ready to leave, and just wants a work note. Discussed use of NSAIDs for pain control, and pt ot f/u with PCP. At this time, pt appears safe for discharge. return precautions given. Pt states he understands and agrees to plan.   Final Clinical Impressions(s) / ED Diagnoses   Final diagnoses:  Atypical chest pain    New Prescriptions Discharge Medication List as of 02/25/2017 10:54 PM    START taking these medications   Details  !! ibuprofen (ADVIL,MOTRIN) 800 MG tablet Take 1 tablet (800 mg total) by mouth 3 (three) times daily with meals., Starting Tue 02/25/2017, Print     !! - Potential duplicate medications found. Please discuss with provider.       Alveria Apley, PA-C 02/26/17 0115    Benjiman Core, MD 03/05/17 [phone]
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
## 2021-01-31 ENCOUNTER — Emergency Department (HOSPITAL_COMMUNITY)
Admission: EM | Admit: 2021-01-31 | Discharge: 2021-01-31 | Disposition: A | Discharge status: Home / Self Care | Payer: Self-pay | Attending: Emergency Medicine | Admitting: Emergency Medicine

## 2021-01-31 ENCOUNTER — Other Ambulatory Visit: Payer: Self-pay

## 2021-01-31 ENCOUNTER — Encounter (HOSPITAL_COMMUNITY): Payer: Self-pay | Admitting: Emergency Medicine

## 2021-01-31 DIAGNOSIS — L237 Allergic contact dermatitis due to plants, except food: Secondary | ICD-10-CM | POA: Insufficient documentation

## 2021-01-31 DIAGNOSIS — J45909 Unspecified asthma, uncomplicated: Secondary | ICD-10-CM | POA: Insufficient documentation

## 2021-01-31 DIAGNOSIS — F17210 Nicotine dependence, cigarettes, uncomplicated: Secondary | ICD-10-CM | POA: Insufficient documentation

## 2021-01-31 MED ORDER — DEXAMETHASONE SODIUM PHOSPHATE 10 MG/ML IJ SOLN
10.0000 mg | Freq: Once | INTRAMUSCULAR | Status: AC
  Administered 2021-01-31: 10 mg via INTRAMUSCULAR
  Filled 2021-01-31: qty 1

## 2021-01-31 MED ORDER — METHYLPREDNISOLONE 4 MG PO TBPK: ORAL_TABLET | ORAL | 0 refills | Status: AC

## 2021-01-31 NOTE — ED Provider Notes (Signed)
[facility] EMERGENCY DEPARTMENT Provider Note   CSN: [id number] Arrival date & time: 01/31/21  0236     History Chief Complaint  Patient presents with   Rash    [name] [name] is a 25 y.o. male.  Patient presents with complaints of poison oak.  Patient reports that he has an itchy rash all over his body.  Patient complaining of rash on his arms and legs mainly.      Past Medical History:  Diagnosis Date   Asthma     Patient Active Problem List   Diagnosis Date Noted   FOREIGN BODY, LEG 09/29/2008    History reviewed. No pertinent surgical history.     Family History  Problem Relation Age of Onset   Asthma Mother    Diabetes Other     Social History   Tobacco Use   Smoking status: Every Day    Packs/day: 1.00    Types: Cigarettes   Smokeless tobacco: Never  Vaping Use   Vaping Use: Never used  Substance Use Topics   Alcohol use: Not Currently    Comment: <1 time a month   Drug use: No    Home Medications Prior to Admission medications   Medication Sig Start Date End Date Taking? Authorizing Provider  methylPREDNISolone (MEDROL DOSEPAK) 4 MG TBPK tablet As directed 01/31/21  Yes Elnor Renovato, Canary Brim, MD  albuterol (PROVENTIL HFA;VENTOLIN HFA) 108 (90 Base) MCG/ACT inhaler Inhale 2 puffs into the lungs every 6 (six) hours as needed for wheezing or shortness of breath.    [provider]  dexamethasone (DECADRON) 4 MG tablet Take 1 tablet (4 mg total) by mouth 2 (two) times daily with a meal. Patient not taking: Reported on 02/25/2017 08/15/15   Ivery Quale, PA-C  diazepam (VALIUM) 5 MG tablet Take 1 tablet (5 mg total) by mouth every 8 (eight) hours as needed for muscle spasms. Patient not taking: Reported on 02/25/2017 10/25/15   Pauline Aus, PA-C  diphenhydrAMINE (BENADRYL) 25 mg capsule Take 1 capsule (25 mg total) by mouth every 6 (six) hours as needed. Patient not taking: Reported on 02/25/2017 02/03/17   Jaynie Crumble, PA-C   famotidine (PEPCID) 20 MG tablet Take 1 tablet (20 mg total) by mouth 2 (two) times daily. Patient not taking: Reported on 02/25/2017 03/09/16   Vanetta Mulders, MD  HYDROcodone-acetaminophen (NORCO/VICODIN) 5-325 MG tablet Take 1 tablet by mouth every 4 (four) hours as needed. Patient not taking: Reported on 02/25/2017 08/15/15   Ivery Quale, PA-C  ibuprofen (ADVIL,MOTRIN) 800 MG tablet Take 1 tablet (800 mg total) by mouth 3 (three) times daily. Patient not taking: Reported on 02/25/2017 02/14/17   Eber Hong, MD  ibuprofen (ADVIL,MOTRIN) 800 MG tablet Take 1 tablet (800 mg total) by mouth 3 (three) times daily with meals. 02/25/17   Caccavale, Sophia, PA-C  mupirocin ointment (BACTROBAN) 2 % Apply to the affected areas TID x 10 days Patient not taking: Reported on 06/21/2014 02/23/14   Triplett, Tammy, PA-C  ondansetron (ZOFRAN ODT) 8 MG disintegrating tablet Take 1 tablet (8 mg total) by mouth every 8 (eight) hours as needed for nausea or vomiting. Patient not taking: Reported on 10/14/2014 06/21/14   Margarita Grizzle, MD  ondansetron (ZOFRAN) 8 MG tablet Take 1 tablet (8 mg total) by mouth every 8 (eight) hours as needed for nausea. Patient not taking: Reported on 06/21/2014 01/25/13   Burgess Amor, PA-C  predniSONE (STERAPRED UNI-PAK 21 TAB) 10 MG (21) TBPK tablet Take by mouth  daily. Take 6 tabs by mouth daily  for 2 days, then 5 tabs for 2 days, then 4 tabs for 2 days, then 3 tabs for 2 days, 2 tabs for 2 days, then 1 tab by mouth daily for 2 days Patient not taking: Reported on 02/25/2017 02/03/17   Jaynie Crumble, PA-C  PROAIR HFA 108 (90 BASE) MCG/ACT inhaler inhale 2 puffs by mouth every 4 hours AS NEEDED FOR WHEEZING Patient not taking: Reported on 02/25/2017 03/17/13   Merlyn Albert, MD  traMADol (ULTRAM) 50 MG tablet Take 1 tablet (50 mg total) by mouth every 6 (six) hours as needed. Patient not taking: Reported on 02/25/2017 10/25/15   Pauline Aus, PA-C    Allergies     Patient has no known allergies.  Review of Systems   Review of Systems  Skin:  Positive for rash.  All other systems reviewed and are negative.  Physical Exam Updated Vital Signs BP (!) 137/101   Pulse 91   Temp 98.3 F (36.8 C)   Resp 18   Ht 5\' 7"  (1.702 m)   Wt 70.3 kg   SpO2 100%   BMI 24.28 kg/m   Physical Exam Vitals and nursing note reviewed.  Constitutional:      General: He is not in acute distress.    Appearance: Normal appearance. He is well-developed.  HENT:     Head: Normocephalic and atraumatic.     Right Ear: Hearing normal.     Left Ear: Hearing normal.     Nose: Nose normal.  Eyes:     Conjunctiva/sclera: Conjunctivae normal.     Pupils: Pupils are equal, round, and reactive to light.  Cardiovascular:     Rate and Rhythm: Regular rhythm.     Heart sounds: S1 normal and S2 normal. No murmur heard.   No friction rub. No gallop.  Pulmonary:     Effort: Pulmonary effort is normal. No respiratory distress.     Breath sounds: Normal breath sounds.  Chest:     Chest wall: No tenderness.  Abdominal:     General: Bowel sounds are normal.     Palpations: Abdomen is soft.     Tenderness: There is no abdominal tenderness. There is no guarding or rebound. Negative signs include Murphy's sign and McBurney's sign.     Hernia: No hernia is present.  Musculoskeletal:        General: Normal range of motion.     Cervical back: Normal range of motion and neck supple.  Skin:    General: Skin is warm and dry.     Findings: Rash (diffuse, weepy vesicular rash on extremities) present.  Neurological:     Mental Status: He is alert and oriented to person, place, and time.     GCS: GCS eye subscore is 4. GCS verbal subscore is 5. GCS motor subscore is 6.     Cranial Nerves: No cranial nerve deficit.     Sensory: No sensory deficit.     Coordination: Coordination normal.  Psychiatric:        Speech: Speech normal.        Behavior: Behavior normal.        Thought  Content: Thought content normal.    ED Results / Procedures / Treatments   Labs (all labs ordered are listed, but only abnormal results are displayed) Labs Reviewed - No data to display  EKG None  Radiology No results found.  Procedures Procedures   Medications Ordered in  ED Medications  dexamethasone (DECADRON) injection 10 mg (has no administration in time range)    ED Course  I have reviewed the triage vital signs and the nursing notes.  Pertinent labs & imaging results that were available during my care of the patient were reviewed by me and considered in my medical decision making (see chart for details).    MDM Rules/Calculators/A&P                           Patient presents with an itchy rash on extremities.  Morphology of rash is consistent with a contact dermatitis.  Final Clinical Impression(s) / ED Diagnoses Final diagnoses:  Allergic contact dermatitis due to plants, except food    Rx / DC Orders ED Discharge Orders          Ordered    methylPREDNISolone (MEDROL DOSEPAK) 4 MG TBPK tablet        01/31/21 0251             Gilda Crease, MD 01/31/21 [phone]

## 2021-01-31 NOTE — Discharge Instructions (Addendum)
Take 2 Benadryl tablets every 6 hours as needed for itch.  Take the entire course of prednisone as prescribed.

## 2021-01-31 NOTE — ED Triage Notes (Signed)
Pt c/o having poison oak all over x 2 days.
